# Patient Record
Sex: Male | Born: 1950 | Race: White | Hispanic: No | Marital: Married
Health system: Midwestern US, Community
[De-identification: ages and names within clinical notes are randomized; demographics above are authoritative.]

---
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
## 2013-01-12 NOTE — Plan of Care (Signed)
Outpatient Physical Therapy  [x]  [facility]   Phone: [phone]   Fax: [phone]   []  [place]    Phone: [phone]   Fax: [phone]  []  [place]   Phone: [phone]   Fax: [phone]     To: Referring Practitioner: Dr. Jacqulyn Bath      Patient: Parker Turner   DOB: 1950-07-15   MRN: [id number]  Evaluation Date: 01/12/2013      Diagnosis Information:  ?? Diagnosis: lumbosacral spondylosis without myelopathy   ?? Treatment Diagnosis: back and leg pain     Physical Therapy Certification/Re-Certification Form  Dear Dr. Jacqulyn Bath,   The following patient has been evaluated for physical therapy services and for therapy to continue, Medicare requires monthly physician review of the treatment plan. Please review the attached evaluation and/or summary of the patient's plan of care, and verify that you agree therapy should continue by signing the attached document and sending it back to our office.    Plan of Care/Treatment to date:  []  Therapeutic Exercise    [x]  Modalities: if needed  []  Therapeutic Activity     []  Ultrasound  []  Electrical Stimulation  []  Gait Training      []  Cervical Traction []  Lumbar Traction  []  Neuromuscular Re-education    []  Cold/hotpack []  Iontophoresis   []  Instruction in HEP     Other:  []  Manual Therapy      []              [x]  Aquatic Therapy      []            ?      Frequency/Duration:  # Days per week: []  1 day # Weeks: []  1 week []  5 weeks     [x]  2 days?   []  2 weeks []  6 weeks     []  3 days   []  3 weeks []  7 weeks     []  4 days   [x]  4 weeks []  8 weeks    Rehab Potential: []  Excellent [x]  Good []  Fair  []  Poor       Electronically signed by:  Thurston Pounds, PT [phone]      If you have any questions or concerns, please don't hesitate to call.  Thank you for your referral.      Physician Signature:________________________________Date:__________________  By signing above, therapist???s plan is approved by physician
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
## 2013-02-02 NOTE — Progress Notes (Signed)
Outpatient Physical Therapy  []  [facility]   Phone: [phone]   Fax: [phone]   []  [place]    Phone: [phone]   Fax: [phone]  []  [place]   Phone: [phone]   Fax: [phone]     Physical Therapy Prescription    Date: 02/02/2013    Patient Name: Parker Turner  DOB: [birth year]  MRN: [id number]    Diagnosis: lumbosacral spondylosis w/o myelopathy  Treatment Diagnosis:  Back and leg pain    Referring Physician:  Dr. Jacqulyn Bath    Progress Note:  PN 02/02/13   *  Initially pt had inc pain after pool exercise, but did not have inc pain after Tues 9/16  *pain is still the same in the legs with prolonged standing   * Has received TENS and using it on knees and back and plans to try on shoulder; seems helpful  * maybe 20% improved overall  * HS L -30 R -24  * pirif L mod tightness R Min tightness  * lumbar flex 42 ; L SB 9 R SB 15  * B LE L LE 4+/5 for hip flex, knee flex, others and R LE 5/5  * more aware of posture per pt; recent c/o L lat hand pain along 5th digit. 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 S/S Does not seem to be reproduced with neck ROM; possibly coming from shoulder or elbow. Pt to call MD to set up appt  * tol min in pool 65        With your approval we would like to add the following to the patient's current PT treatment:    [x]  Aquatic Exercise 2x wk x 4 wks   []  TENS Unit        []  [facility] Cervical Traction Unit        []  [facility] Lumbar Traction Unit    []  Quad/Standard Cane        []  Rolling/Standard Walker         []  Iontophoresis: Dexamethasone 4mg /ml injectable-30 ml vial     Quantity: 1 vial for iontophoresis     As needed        Electronically signed by:  Thurston Pounds, PT [phone]    If you have any questions or concerns, please don't hesitate to call.  Thank you for your referral.    Physician Signature:________________________________Date:__________________  By signing above, therapist???s plan is approved by physician
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
## 2013-02-09 NOTE — Other (Signed)
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  Pt c/o knee, ankle and feet pain.  Objective:  Observation:   Test measurements:      Key  B= Belt DB= Dumbells T= Theratube   G=Gloves N= Noodles W= Weights   P= Paddles WW=Water Walker K= Kickboard        Transfers:   steps with bilat handrails      % Immersion:  75%            Ambulation:   Exercises UE:      Forwards  4+1 Shoulder Shrugs     Lateral 2 Shoulder circles 10    Marching   1 Scapular Retraction  10    Retro   Rolling  10    Cariocas  Push Downs 10     IR/ER 10     Punching 10   Stretching: bilat Rowing 10   Gastroc/Soleus  20 sec X 3 Elbow Flex/Ext 10   Hamstring  20 sec X 3 Shldr Flex/Ext  10   Knee flex stretch   Shldr Abd/Add 10   Piriformis   Horiz Abd/Add  10   Hip flexor    Arm Circles  10   SKTC   PNF Diagonals    DKTC  UE oscillations f/b, s/s    ITB   Wall Push-ups    Quad  Figure 8's    Mid back   Buoy pull/push downs    UT  Tband rows    Rhom  Tband lats    Post Shoulder      Pec          Cervical:       AROM Flex       AROM Extension     LEs exercises:  bilat AROM Side Bending    Marching  10 AROM Rotation    Heel Raises  10 Chin tucks    Toe Raises  10 Chin nods     Squats  10      Hamstring Curls   10      Hip Flexion  10 Balance:     Hip Abduction  10 SLS 1 min each LE   Hip Extension  Tandem stance 30 sec x 2   Hip Circles  10 NBOS eyes open    Glut Set  10 NBOS eyes closed    TA set  10 Hand to Opposite Knee 10   Hip Adduction    Box Step     Hip IR   Noodle Stance  30 sec X 2  Hip ER  Stop/Go Gait    Fig 8's  Switch Gait                Seated: bilat Functional:    Ankle Pumps 30 Step up forward    Ankle circles 10 Step up lateral    Knee flex & ext 30 Step down    Hip Abd & Adduction 30 Shallow Water squats    Bicycle  30 as able Crate Lifts    Add Set with ball 10 Lunges forward    LX stab with med ball throws  Lunges lateral    Ankle INV  Lunges retro    Ankle EV  Lower ab curl with noodle      Upper ab curl with ball      Med ball straight lifts      Med ball diagonal lifts      Hydrorider          Noodle:      Leg Press 15 bilat Deep Water:    Noodle hang at wall 5 min regular [employer] Jog    Noodle hang deep water  Jumping Jacks    Noodle Bicycle  Heel to toe      Hand to opposite knee      Cross country skier      Rocking Horse    Verbal cues for proper posture and no increase pain with gait and exercise.      Timed Code Treatment Minutes:  75    Total Treatment Minutes:  75    Treatment/Activity Tolerance:  [x]  Patient tolerated treatment well []  Patient limited by fatique  []  Patient limited by pain []  Patient limited by other medical complications  []  Other:     Prognosis: [x]  Good []  Fair  []  Poor    Patient Requires Follow-up: [x]  Yes  []  No    Plan:   []  Continue per plan of care []  Alter current plan (see comments)  [x]  Plan of care initiated []  Hold pending MD visit []  Discharge    Plan for Next Session:  Gradually increase gait and exercise with an emphasis on posture, proper exercise tech, and no increase pain.  Therapist will educate patient on lumbopelvic stabilization with exercise and ADL's.  Increase LE and UE reps x 12 each.    Electronically signed by: Lynnda Shields, PTA,  [phone]

## 2013-02-14 NOTE — Other (Signed)
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, possible L RTC tear, OA and possible RA, 3 crushed lumbar vertebrae '76(no sx; wore a back brace for a year)    Diagnosis:  Lumbosacral spondylosis w/o myelopathy  Treatment Diagnosis:  Back and leg pain  Insurance/Certification information: [employer] (Sept 1)  Referring Physician:  Dr Jacqulyn Bath  Plan of care signed (Y/N): faxed 01/12/13     Visit# / total visits: 10/8  Pain level: 3-4/10 LB, knees and feet But was 7/10 LB pain over the weekend      Progress Note: [x]   Yes  []   No  Next due by: Visit #10:      Subjective:  Pt reports complaining of increase LB pain Friday and over the weekend.  Pt states getting a knee sleeve for the TENS unit however it does not seem to be working properly.  Pt to call TENS company to troubleshoot.    Objective:  Observation:   Test measurements:      Key  B= Belt DB= Dumbells T= Theratube   G=Gloves N= Noodles W= Weights   P= Paddles WW=Water Walker K= Kickboard        Transfers:   steps with bilat handrails      % Immersion:  75%            Ambulation:   Exercises UE:      Forwards  4+1 Shoulder Shrugs     Lateral 3 Shoulder circles 10    Marching   1 Scapular Retraction  10    Retro   Rolling  10    Cariocas  Push Downs 10     IR/ER 10     Punching 10   Stretching: bilat Rowing 10   Gastroc/Soleus  20 sec X 3 Elbow Flex/Ext 10   Hamstring  20 sec X 3 Shldr Flex/Ext  10   Knee flex stretch   Shldr Abd/Add 10   Piriformis   Horiz Abd/Add  10   Hip flexor    Arm Circles  10   SKTC   PNF Diagonals    DKTC  UE oscillations f/b, s/s    ITB   Wall Push-ups    Quad  Figure 8's    Mid back   Buoy pull/push downs    UT  Tband rows    Rhom  Tband lats    Post Shoulder      Pec          Cervical:       AROM Flex       AROM Extension      LEs exercises:  bilat AROM Side Bending    Marching  12 AROM Rotation    Heel Raises  12 Chin tucks    Toe Raises  12 Chin nods     Squats  12      Hamstring Curls  12      Hip Flexion  12 Balance:     Hip Abduction  12 SLS 1 min each LE   Hip Extension  Tandem stance 30 sec x 2   Hip Circles  12 NBOS eyes open    Glut Set  12 NBOS eyes closed    TA set  12 Hand to Opposite Knee 10   Hip Adduction    Box Step     Hip IR   Noodle Stance  30 sec X 2   Hip ER  Stop/Go Gait    Fig 8's  Switch Gait                Seated: bilat Functional: bilat   Ankle Pumps 30 Step up forward 5 each LE   Ankle circles 10 Step up lateral    Knee flex & ext 30 Step down    Hip Abd & Adduction 30 Shallow Water squats    Bicycle  30 as able Crate Lifts    Add Set with ball 10 Lunges forward    LX stab with med ball throws  Lunges lateral    Ankle INV  Lunges retro    Ankle EV  Lower ab curl with noodle      Upper ab curl with ball      Med ball straight lifts      Med ball diagonal lifts      Hydrorider          Noodle:      Leg Press 15 bilat Deep Water:    Noodle hang at wall Unable due to time Jog    Noodle hang deep water  Jumping Jacks    Noodle Bicycle  Heel to toe      Hand to opposite knee      Cross country skier      Rocking Horse    Verbal cues for proper posture and no increase pain with gait and exercise.      Timed Code Treatment Minutes:  75    Total Treatment Minutes:  75    Treatment/Activity Tolerance:  [x]  Patient tolerated treatment well []  Patient limited by fatique  []  Patient limited by pain []  Patient limited by other medical complications  []  Other:     Prognosis: [x]  Good []  Fair  []  Poor    Patient Requires Follow-up: [x]  Yes  []  No    Plan:   []  Continue per plan of care []  Alter current plan (see comments)  [x]  Plan of care initiated []  Hold pending MD visit []  Discharge    Plan for Next Session:  Gradually increase gait and exercise with an emphasis on posture, proper exercise tech, and no increase pain.   Therapist will educate patient on lumbopelvic stabilization with exercise and ADL's. Increase step ups as tolerated.  Add box step.    Electronically signed by: Lynnda Shields, PTA, [phone]

## 2013-02-16 NOTE — Other (Signed)
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  Pain decreased overall.  Objective:  Observation:   Test measurements:      Key  B= Belt DB= Dumbells T= Theratube   G=Gloves N= Noodles W= Weights   P= Paddles WW=Water Walker K= Kickboard        Transfers:   steps with bilat handrails      % Immersion:  75%            Ambulation:   Exercises UE:      Forwards  4+1 Shoulder Shrugs     Lateral 3 Shoulder circles 10    Marching   2 Scapular Retraction  10    Retro   Rolling  10    Cariocas  Push Downs 10     IR/ER 10     Punching 10   Stretching: bilat Rowing 10   Gastroc/Soleus  20 sec X 3 Elbow Flex/Ext 10   Hamstring  20 sec X 3 Shldr Flex/Ext  10   Knee flex stretch   Shldr Abd/Add 10   Piriformis   Horiz Abd/Add  10   Hip flexor    Arm Circles  10   SKTC   PNF Diagonals    DKTC  UE oscillations f/b, s/s    ITB   Wall Push-ups    Quad  Figure 8's    Mid back   Buoy pull/push downs    UT  Tband rows    Rhom  Tband lats    Post Shoulder      Pec          Cervical:       AROM Flex       AROM Extension     LEs exercises:  bilat AROM Side Bending    Marching  12 AROM Rotation    Heel Raises  12 Chin tucks    Toe Raises  12 Chin nods     Squats  12      Hamstring Curls  12       Hip Flexion  12 Balance:     Hip Abduction  12 SLS 1 min each LE   Hip Extension  Tandem stance 30 sec x 2   Hip Circles  12 NBOS eyes open    Glut Set  12 NBOS eyes closed    TA set  12 Hand to Opposite Knee 10   Hip Adduction    Box Step  3x each with verbal cues   Hip IR   Noodle Stance  30 sec X 2   Hip  ER  Stop/Go Gait    Fig 8's  Switch Gait                Seated: bilat Functional: bilat   Ankle Pumps 30 Step up forward 5 each LE   Ankle circles 10 Step up lateral    Knee flex & ext 30 Step down    Hip Abd & Adduction 30 Shallow Water squats    Bicycle  30 as able Crate Lifts    Add Set with ball 10 Lunges forward    LX stab with med ball throws  Lunges lateral    Ankle INV  Lunges retro    Ankle EV  Lower ab curl with noodle      Upper ab curl with ball      Med ball straight lifts      Med ball diagonal lifts      Hydrorider          Noodle:      Leg Press 15 bilat Deep Water:    Noodle hang at wall Unable due to time Jog    Noodle hang deep water  Jumping Jacks    Noodle Bicycle  Heel to toe      Hand to opposite knee      Cross country skier      Rocking Horse    Verbal cues for proper posture and no increase pain with gait and exercise.      Timed Code Treatment Minutes:  75    Total Treatment Minutes:  75    Treatment/Activity Tolerance:  [x]  Patient tolerated treatment well []  Patient limited by fatique  []  Patient limited by pain []  Patient limited by other medical complications  []  Other:     Prognosis: [x]  Good []  Fair  []  Poor    Patient Requires Follow-up: [x]  Yes  []  No    Plan:   []  Continue per plan of care []  Alter current plan (see comments)  [x]  Plan of care initiated []  Hold pending MD visit []  Discharge    Plan for Next Session:  Gradually increase gait and exercise with an emphasis on posture, proper exercise tech, and no increase pain.  Therapist will educate patient on lumbopelvic stabilization with exercise and ADL's. Increase step ups as tolerated.  Increase box step, add crate  lift.    Electronically signed by: Lynnda Shields, PTA, [phone]

## 2013-02-21 NOTE — Other (Signed)
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 Everything hurts today  Objective:  Observation:   Test measurements:      Key  B= Belt DB= Dumbells T= Theratube   G=Gloves N= Noodles W= Weights   P= Paddles WW=Water Walker K= Kickboard        Transfers:   steps with bilat handrails      % Immersion:  75%            Ambulation:   Exercises UE:      Forwards    3 Shoulder Shrugs     Lateral 3 Shoulder circles  12    Marching   1Scapular Retraction  12    Retro   Rolling  12    Cariocas  Push Downs  12     IR/ER  12     Punching  12   Stretching: bilat Rowing  12   Gastroc/Soleus  20 sec X 3 Elbow Flex/Ext  12   Hamstring  20 sec X 3 Shldr Flex/Ext  12   Knee flex stretch   Shldr Abd/Add  12   Piriformis   Horiz Abd/Add  12   Hip flexor    Arm Circles  12   SKTC   PNF Diagonals  12   DKTC  UE oscillations f/b, s/s    ITB   Wall Push-ups    Quad  Figure 8's    Mid back   Buoy pull/push downs    UT  Tband rows    Rhom  Tband lats    Post Shoulder      Pec          Cervical:       AROM Flex       AROM Extension     LEs exercises:  bilat AROM Side Bending    Marching  12 AROM Rotation    Heel Raises  12 Chin tucks    Toe Raises  12 Chin nods     Squats  12      Hamstring Curls  12      Hip Flexion  12 Balance:     Hip  Abduction  12 SLS 1 min each LE   Hip Extension  Tandem stance 30 sec x 2   Hip Circles  12 NBOS eyes open    Glut Set  12 NBOS eyes closed    TA set  12 Hand to Opposite Knee 10   Hip Adduction    Box Step  5x each with verbal cues   Hip IR   Noodle Stance  30 sec X 2  Hip ER  Stop/Go Gait    Fig 8's  Switch Gait                Seated: bilat Functional: bilat   Ankle Pumps 30 Step up forward 5 each LE   Ankle circles 10 Step up lateral    Knee flex & ext 30 Step down    Hip Abd & Adduction 30 Shallow Water squats    Bicycle  30 as able Crate Lifts    Add Set with ball 10 Lunges forward    LX stab with med ball throws  Lunges lateral    Ankle INV  Lunges retro    Ankle EV  Lower ab curl with noodle      Upper ab curl with ball      Med ball straight lifts      Med ball diagonal lifts      Hydrorider          Noodle:      Leg Press 15 bilat Deep Water:    Noodle hang at wall  Jog    Noodle hang deep water  Jumping Jacks    Noodle Bicycle  Heel to toe      Hand to opposite knee      Cross country skier      Rocking Horse    Verbal cues for proper posture and no increase pain with gait and exercise.      Timed Code Treatment Minutes:  75    Total Treatment Minutes:  75    Treatment/Activity Tolerance:  []  Patient tolerated treatment well []  Patient limited by fatique  [x]  Patient limited by pain []  Patient limited by other medical complications  [x]  Other: Exercises modified due to pain    Prognosis: [x]  Good []  Fair  []  Poor    Patient Requires Follow-up: [x]  Yes  []  No    Plan:   [x]  Continue per plan of care []  Alter current plan (see comments)  []  Plan of care initiated []  Hold pending MD visit []  Discharge    Plan for Next Session:  Gradually increase gait and exercise with an emphasis on posture, proper exercise tech, and no increase pain.  Therapist will educate patient on lumbopelvic stabilization with exercise and ADL's. Increase step ups as tolerated.  Resume ex and add crate lift as tol    Electronically  signed by: Georgeanna HarrisonJennifer A Yates, PTA  [phone]

## 2013-02-23 NOTE — Other (Signed)
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, possible L RTC tear, OA and possible RA, 3 crushed lumbar vertebrae '76(no sx; wore a back brace for a year)    Diagnosis:  Lumbosacral spondylosis w/o myelopathy  Treatment Diagnosis:  Back and leg pain  Insurance/Certification information: [employer] (Sept 1)  Referring Physician:  Dr Jacqulyn BathLong  Plan of care signed (Y/N): faxed 01/12/13     Visit# / total visits: 13/  Pain level:   3-4/10 LB pain     Progress Note: [x]   Yes  []   No  Next due by: Visit #10:      Subjective:  A little better today than Tuesday  Objective:  Observation:   Test measurements:      Key  B= Belt DB= Dumbells T= Theratube   G=Gloves N= Noodles W= Weights   P= Paddles WW=Water Walker K= Kickboard        Transfers:   steps with bilat handrails      % Immersion:  75%            Ambulation:   Exercises UE:      Forwards  4+1  Shoulder Shrugs     Lateral 3 Shoulder circles  12    Marching   1Scapular Retraction  12    Retro   Rolling  12    Cariocas  Push Downs  12     IR/ER  12     Punching  12   Stretching: bilat Rowing  12   Gastroc/Soleus  20 sec X 3 Elbow Flex/Ext  12   Hamstring  20 sec X 3 Shldr Flex/Ext  12   Knee flex stretch   Shldr Abd/Add  12   Piriformis   Horiz Abd/Add  12   Hip flexor    Arm Circles  12   SKTC   PNF Diagonals  12   DKTC  UE oscillations f/b, s/s    ITB   Wall Push-ups    Quad  Figure 8's    Mid back   Buoy pull/push downs    UT  Tband rows    Rhom  Tband lats    Post Shoulder      Pec          Cervical:       AROM Flex       AROM Extension     LEs exercises:  bilat AROM Side Bending    Marching  12 AROM Rotation    Heel Raises  12 Chin tucks    Toe Raises  12 Chin nods     Squats  12      Hamstring Curls  12      Hip Flexion  12 Balance:     Hip Abduction   12 SLS 1 min each LE   Hip Extension  Tandem stance 30 sec x 2   Hip Circles  12 NBOS eyes open    Glut Set  12 NBOS eyes closed    TA set  12 Hand to Opposite Knee 10   Hip Adduction    Box Step  5x each with verbal cues   Hip IR   Noodle Stance  30 sec X 2   Hip  ER  Stop/Go Gait    Fig 8's  Switch Gait                Seated: bilat Functional: bilat   Ankle Pumps 30 Step up forward 5 each LE   Ankle circles 10 Step up lateral    Knee flex & ext 30 Step down    Hip Abd & Adduction 30 Shallow Water squats    Bicycle  30 as able Crate Lifts    Add Set with ball 10 Lunges forward    LX stab with med ball throws  Lunges lateral    Ankle INV  Lunges retro    Ankle EV  Lower ab curl with noodle      Upper ab curl with ball      Med ball straight lifts      Med ball diagonal lifts      Hydrorider          Noodle:      Leg Press 15 bilat Deep Water:    Noodle hang at wall  Jog    Noodle hang deep water  Jumping Jacks    Noodle Bicycle  Heel to toe      Hand to opposite knee      Cross country skier      Rocking Horse    Verbal cues for proper posture and no increase pain with gait and exercise.      Timed Code Treatment Minutes:  75    Total Treatment Minutes:  75    Treatment/Activity Tolerance:   Patient tolerated treatment well  Patient limited by fatique   Patient limited by pain  Patient limited by other medical complications   Other:   Prognosis:  Good  Fair   Poor    Patient Requires Follow-up:  Yes   No    Plan:    Continue per plan of care  Alter current plan (see comments)   Plan of care initiated  Hold pending MD visit  Discharge    Plan for Next Session:  Gradually increase gait and exercise with an emphasis on posture, proper exercise tech, and no increase pain.  Therapist will educate patient on lumbopelvic stabilization with exercise and ADL's. Increase step ups as tolerated.   add crate lift as tol    Electronically signed by: Georgeanna Harrison, PTA  [phone]

## 2013-02-28 NOTE — Other (Signed)
Physical Therapy  Cancellation/No-show Note  Patient Name:  Parker Turner  DOB:  June 24, 1950   Date:  02/28/2013  Cancelled visits to date: 1  No-shows to date: 0    For today's appointment patient:  [x]   Cancelled  []   Rescheduled appointment  []   No-show     Reason given by patient:  []   Patient ill  []   Conflicting appointment  []   No transportation    []   Conflict with work  [x]   No reason given  []   Other:     Comments:      Electronically signed by:  Lynnda Shields, PTA  [phone]

## 2013-03-02 NOTE — Discharge Summary (Signed)
Physical Therapy Discharge Note  Date: 03/02/2013    Patient Name: Parker Turner  DOB: 05/04/1951  MRN: [id number]    Diagnosis:lumbosacral spondylosis w/o myelopathy  Treatment Diagnosis: back and leg pain    Referring Physician: Dr. Jacqulyn Bath    Dates of Service:[employer]  Total # of Visits:14  Cancel/No Shows to date:1    G-code (if applicable):    Date G-code Applied:       Treatment to Date:  []  Therapeutic Exercise  []  Modalities:  []  Therapeutic Activity     []  Ultrasound  []  Electrical Stim   []  Gait Training      []  Cervical Traction    []  Lumbar Traction  []  Neuromuscular Re-education  []  Cold/hotpack []  Iontophoresis  []  Instruction in HEP      Other:  []  Manual Therapy       []     [x]  Aquatic Therapy       []                     ?        Significant Findings At Last Visit:    PN 03/02/13   * recently diagnosed with RA; new meds, waiting for them to kick in  *very minimal pain improvement with PT   * cont hep daily  * still needs Meds and TENS for pain relief  *most importantly has better understanding of exercises needed to cont   *overall pain is still the same in the legs with prolonged standing   * HS L -30 R -22  * pirif L mod tightness R Min tightness  * lumbar flex 35 ; L SB 9 R SB 10 - inc tightness noted   * B LE L LE 4+/5 for hip flex, knee flex, others and R LE 5/5  * To have EMG on L UE for s/s in L elbow  * tol min in pool 65        Goal Status:  []  Achieved [x]  Partially Achieved  []  Not Achieved     Reason for Discharge:  []  Goals Met   []  Patient did not return after initial evaluation   [x]  Progress Plateaued []  Missed _____ scheduled appointments   []  No insurance coverage []  Patient terminated therapy   []  Other:        PT recommendation:   [x]  Patient now discharged with HEP      [x]  Other: issued 30 day FC pass to cont independently     Discharge discussed with:  [x]  Patient  []  Caregiver      []  Other        Electronically signed by:  Thurston Pounds, PT [phone]    If you have any  questions or concerns, please don't hesitate to call.  Thank you for your referral.
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
## 2013-11-08 NOTE — Plan of Care (Signed)
Outpatient Physical Therapy  [x]  [facility]   Phone: [phone]   Fax: [phone]   []  [facility]  Phone: [phone]              Fax: [phone]  []  [facility]   Phone: [phone]   Fax: [phone]     To: Referring Practitioner: Dr. Jacqulyn BathLong      Patient: Parker Turner   DOB: 09/08/1950   MRN: [id number]  Evaluation Date: 11/08/2013      Diagnosis Information:  ?? Diagnosis: LBP   ?? Treatment Diagnosis: pain and LLE weakness     Physical Therapy Certification/Re-Certification Form  Dear Dr. Jacqulyn BathLong  The following patient has been evaluated for physical therapy services and for therapy to continue, Medicare requires monthly physician review of the treatment plan. Please review the attached evaluation and/or summary of the patient's plan of care, and verify that you agree therapy should continue by signing the attached document and sending it back to our office.    Plan of Care/Treatment to date:  []  Therapeutic Exercise    []  Modalities:  []  Therapeutic Activity     []  Ultrasound  []  Electrical Stimulation  []  Gait Training      []  Cervical Traction []  Lumbar Traction  []  Neuromuscular Re-education    []  Cold/hotpack []  Iontophoresis   []  Instruction in HEP     Other:  []  Manual Therapy      []              [x]  Aquatic Therapy      []            ?      Frequency/Duration:  # Days per week: []  1 day # Weeks: []  1 week []  5 weeks     [x]  2 days?   []  2 weeks []  6 weeks     []  3 days   []  3 weeks []  7 weeks     []  4 days   []  4 weeks [x]  8 weeks    Rehab Potential: []  Excellent [x]  Good [x]  Fair  []  Poor       Electronically signed by:  Derrill KayMary Anand Tejada PT  [phone]    If you have any questions or concerns, please don't hesitate to call.  Thank you for your referral.      Physician Signature:________________________________Date:__________________  By signing above, therapist???s plan is approved by physician

## 2013-11-08 NOTE — Progress Notes (Signed)
Physical Therapy  Initial Assessment  Date: 11/08/2013  Patient Name: Parker BaliDonnie Briley  MRN: [id number]  DOB: 05/15/1951     Treatment Diagnosis: pain and LLE weakness    Restrictions  Restrictions/Precautions  Restrictions/Precautions: Fall Risk (minimal)    Subjective   General  Chart Reviewed: Yes  Patient assessed for rehabilitation services?: Yes  Additional Pertinent Hx: RA,OA PAD, cervical radiculopathy  Family / Caregiver Present: No  Referring Practitioner: Dr. Jacqulyn BathLong  Referral Date : 10/05/13  Diagnosis: LBP  PT Visit Information  Onset Date: 09/15/13  PT Insurance Information: [employer]  Total # of Visits Approved: 12  Total # of Visits to Date: 1  Subjective  Subjective: Patient has pain in am then decreases as the day progresses then increases  Pain Screening  Patient Currently in Pain: Yes  Pain Assessment  Pain Assessment: 0-10  Pain Level: 6  Pain Type: Chronic pain  Pain Location: Back;Elbow;Shoulder;Leg;Knee  Pain Orientation: Right;Left (left> right)  Pain Descriptors: Aching;Pins and needles  Pain Frequency: Intermittent  Pain Onset: Progressive  Clinical Progression: Gradually worsening  Patient's Stated Pain Goal: 2  Pain Intervention(s): Medication (see eMar);Heat applied;Shower  Multiple Pain Sites:  (RA/OA throughout the body)  Vision/Hearing     Home Living  Home Living  Type of Home: Apartment  Home Layout: One level  Bathroom Toilet: Standard  Bathroom Accessibility: Accessible  Objective  IADL History  Active Driver: Yes  Mode of Transportation: Car  Occupation: Retired  Leisure & Hobbies: fishing and hunting  Observation/Palpation  Posture: Fair  Palpation: ttp B LB  Body Mechanics: fair  Edema: Malleolii 27.5 cm, figure 8 52cm, mid foot 22 cm  PROM RLE (degrees)  RLE ROM: Exceptions  R Ankle Dorsiflexion 0-20: -8  R Ankle Plantar Flexion 0-45: 50  R Ankle Forefoot Inversion 0-40: 20  AROM RLE (degrees)  RLE AROM: Exceptions  R Ankle Dorsiflexion 0-20: -10  R Ankle Plantar Flexion 0-45:  50  R Ankle Forefoot Inversion 0-40: 15  Spine  Thoracic: increased kyphosis  Lumbar: WFL pain at end ranges  Special Tests: SLR neg B  Strength RLE  Strength RLE: WNL (5-/5)  Strength LLE  Strength LLE: Exception  L Hip Flexion: 4-/5  L Hip ABduction: 4-/5  L Knee Flexion: 4-/5  L Knee Extension: 4/5  L Ankle Dorsiflexion: 4-/5  L Ankle Plantar Flexion: 4/5     Additional Measures  Flexibility: HS L 45 deg, R 30 deg; B piriformis minimal tightness  Special Tests: neg SLR B  Assessment   Assessment  Assessment: Decreased functional mobility ;Decreased ADL status;Decreased strength;Decreased endurance  Assessment: patient has symptoms consistent with OA,RA and mostly with PAD  Comments: PLOF - adl's severely limited in last year from PAD  Treatment Diagnosis: pain and LLE weakness  Prognosis: Fair  Requires PT Follow Up: Yes  Timed Code Treatment Minutes: 20 Minutes  Total Treatment Time: 50  Activity Tolerance  Activity Tolerance: Patient limited by pain  Plan  Plan: Plan of care initiated         Plan   Plan  Plan: Plan of care initiated  Timed Code Treatment Minutes: 20 Minutes  Total Treatment Time: 50  Frequency and duration of tx  Days: 2  Weeks: 8    G-Code       Goals  Short term goals  Time Frame for Short term goals: 3 weeks  Short term goal 1: Decrease pain to 4/10 at worst  Short term goal 2: Patient  able to tolerate 45 min pool exercises without lower legs hurting  Short term goal 3: Increased ADL's by 25%  Long term goals  Time Frame for Long term goals : 6 weeks  Long term goal 1: Decrease pain to 0/10 in the water and 3/10 at worst  Long term goal 2: Patient able to tolerate 60 minutes of pool exercises  Long term goal 3: Patient able to return to some fishing activities per his goal       Derrill KayMary Glenna Brunkow  License and Documentation Cosign  Therapy License Number: [id number]  Cosign Documentation: Desma PaganiniMary Ann Chason Mciver,PT

## 2013-11-09 NOTE — Other (Signed)
Physical Therapy Aquatic Flow Sheet   Date:  11/09/2013    Patient Name:  Parker BaliDonnie Arno    DOB:  12/16/1950    Pertinent Medical History: RA, OA, PAD, cervical radiculopathy    Diagnosis:  LBP  Treatment Diagnosis:  LBP and L LE weakness    Insurance/Certification information: [employer]  Referring Physician:  Dr. Jacqulyn BathLong     Visit# / total visits:  2/8  Pain level: 6/10       Progress Note: []   Yes  [x]   No  Next due by: Visit #10:      Subjective:  Pain mainly in LEs, shoulders, and neck    Objective:  Observation: See eval  Test measurements:      Key  B= Belt DB= Dumbells T= Theratube   G=Gloves N= Noodles W= Weights   P= Paddles WW=Water Walker K= Kickboard        Transfers:  steps       % Immersion: 75%            Ambulation:   Exercises UE:      Forwards 2 Shoulder Shrugs 10x    Lateral 1 Shoulder circles 10x    Marching    Scapular Retraction 10x    Retro   Rolling 10x     Cariocas  Push Downs 10x     IR/ER 10x     Punching 10x   Stretching:  Rowing 10x   Gastroc/Soleus 3x20 sec  Elbow Flex/Ext 10x   Hamstring 3x20 sec  Shldr Flex/Ext     Knee flex stretch   Shldr Abd/Add    Piriformis   Horiz Abd/Add     Hip flexor    Arm Circles     SKTC   PNF Diagonals    DKTC  UE oscillations f/b, s/s    ITB   Wall Push-ups    Quad  Figure 8's    Mid back   Buoy pull/push downs    UT  Tband rows    Rhom  Tband lats    Post Shoulder  Lumbar Rot w/ paddles    Pec   Small ball pull downs             Cervical:       AROM Flex       AROM Extension     LEs exercises:   AROM Side Bending    Marching 10x  AROM Rotation    Heel Raises 10x  Chin tucks    Toe Raises 10x  Chin nods     Squats 10x       Hamstring Curls 10x       Hip Flexion 10x  Balance:     Hip Abduction 10x SLS    Hip Circles 10x Tandem stance    TA set 10x NBOS eyes open    Glut Set 10x NBOS eyes closed    Hip Extension  Hand to Opposite Knee    Hip Adduction    Box Step     Hip IR   Noodle Stance     Hip ER  Stop/Go Gait    Fig 8's  Switch Gait                 Seated:  Functional:    Ankle Pumps 10x Step up forward    Ankle circles  Step up lateral    Knee flex & ext 10x Step down    Hip Abd & Adduction 10x  Shallow Water squats    Bicycle  10x Crate Lifts    Add Set with ball 10x Lunges forward    LX stab with med ball throws  Lunges lateral    Ankle INV  Lunges retro    Ankle EV  Lower ab curl with noodle      Upper ab curl with ball      Med ball straight lifts      Med ball diagonal lifts      Hydrorider          Noodle:      Leg Press  Deep Water:    Noodle hang at wall  Jog    Noodle hang deep water  Jumping Jacks    Noodle Bicycle  Heel to toe      Hand to opposite knee      Cross country skier      Rocking Horse        Timed Code Treatment Minutes:  35    Total Treatment Minutes: 35    Treatment/Activity Tolerance:  [x]  Patient tolerated treatment well []  Patient limited by fatique  []  Patient limited by pain  []  Patient limited by other medical complications  []  Other:     Prognosis: [x]  Good []  Fair  []  Poor    Patient Requires Follow-up: [x]  Yes  []  No    Plan:   [x]  Continue per plan of care []  Alter current plan (see comments)  []  Plan of care initiated []  Hold pending MD visit []  Discharge    Plan for Next Session:  Increase chair and gait.  Add long lever UEs    Electronically signed by: Molli KnockLisa Marie  Rashell Shambaugh, PT [phone]

## 2013-11-14 NOTE — Other (Signed)
Physical Therapy Aquatic Flow Sheet   Date:  11/14/2013    Patient Name:  Parker Turner    DOB:  12/30/1950    Pertinent Medical History: RA, OA, PAD, cervical radiculopathy    Diagnosis:  LBP  Treatment Diagnosis:  LBP and L LE weakness    Insurance/Certification information: [employer]  Referring Physician:  Dr. Jacqulyn BathLong     Visit# / total visits:  3/8  Pain level: 5/10       Progress Note: []   Yes  [x]   No  Next due by: Visit #10:      Subjective:  Doing about the same today.     Objective:  Observation: See eval  Test measurements:      Key  B= Belt DB= Dumbells T= Theratube   G=Gloves N= Noodles W= Weights   P= Paddles WW=Water Walker K= Kickboard        Transfers:  steps       % Immersion: 75%            Ambulation:   Exercises UE:      Forwards 4 Shoulder Shrugs 10x    Lateral 2 Shoulder circles 10x    Marching    Scapular Retraction 10x    Retro   Rolling 10x     Cariocas  Push Downs 10x     IR/ER 10x     Punching 10x   Stretching:  Rowing 10x   Gastroc/Soleus 3x20 sec  Elbow Flex/Ext 10x   Hamstring 3x20 sec  Shldr Flex/Ext 10x    Knee flex stretch   Shldr Abd/Add 10x   Piriformis   Horiz Abd/Add 10x    Hip flexor    Arm Circles 10x    SKTC   PNF Diagonals 10x   DKTC  UE oscillations f/b, s/s    ITB   Wall Push-ups    Quad  Figure 8's    Mid back   Buoy pull/push downs    UT  Tband rows    Rhom  Tband lats    Post Shoulder  Lumbar Rot w/ paddles    Pec   Small ball pull downs             Cervical:       AROM Flex       AROM Extension     LEs exercises:   AROM Side Bending    Marching 10x  AROM Rotation    Heel Raises 10x  Chin tucks    Toe Raises 10x  Chin nods     Squats 10x       Hamstring Curls 10x       Hip Flexion 10x  Balance:     Hip Abduction 10x SLS    Hip Circles 10x Tandem stance    TA set 10x NBOS eyes open    Glut Set 10x NBOS eyes closed    Hip Extension  Hand to Opposite Knee    Hip Adduction    Box Step     Hip IR   Noodle Stance     Hip ER  Stop/Go Gait    Fig 8's  Switch Gait                 Seated:  Functional:    Ankle Pumps 15x Step up forward    Ankle circles  Step up lateral    Knee flex & ext 15x Step down    Hip Abd & Adduction 15x Shallow  Water squats    Bicycle  15x Crate Lifts    Add Set with ball 10x Lunges forward    LX stab with med ball throws  Lunges lateral    Ankle INV  Lunges retro    Ankle EV  Lower ab curl with noodle      Upper ab curl with ball      Med ball straight lifts      Med ball diagonal lifts      Hydrorider          Noodle:      Leg Press  Deep Water:    Noodle hang at wall  Jog    Noodle hang deep water x5 mins Jumping Jacks    Noodle Bicycle  Heel to toe      Hand to opposite knee      Cross country skier      Rocking Horse        Timed Code Treatment Minutes:  45    Total Treatment Minutes: 45    Treatment/Activity Tolerance:  [x]  Patient tolerated treatment well []  Patient limited by fatique  []  Patient limited by pain  []  Patient limited by other medical complications  []  Other:     Prognosis: [x]  Good []  Fair  []  Poor    Patient Requires Follow-up: [x]  Yes  []  No    Plan:   [x]  Continue per plan of care []  Alter current plan (see comments)  []  Plan of care initiated []  Hold pending MD visit []  Discharge    Plan for Next Session:  Increase chair.  Add SLS and H-K    Electronically signed by: Molli KnockLisa Marie  Dakai Braithwaite, PT [phone]

## 2013-11-16 NOTE — Other (Signed)
Physical Therapy Aquatic Flow Sheet   Date:  11/16/2013    Patient Name:  Parker Turner    DOB:  11/10/1950    Pertinent Medical History: RA, OA, PAD, cervical radiculopathy    Diagnosis:  LBP  Treatment Diagnosis:  LBP and L LE weakness    Insurance/Certification information: [employer]  Referring Physician:  Dr. Jacqulyn BathLong     Visit# / total visits:  4/8  Pain level: 6-7/10       Progress Note: []   Yes  [x]   No  Next due by: Visit #10:      Subjective:  No new complaints today    Objective:  Observation: See eval  Test measurements:      Key  B= Belt DB= Dumbells T= Theratube   G=Gloves N= Noodles W= Weights   P= Paddles WW=Water Walker K= Kickboard        Transfers:  steps       % Immersion: 75%            Ambulation:   Exercises UE:      Forwards 4 Shoulder Shrugs 10x    Lateral 2 Shoulder circles 10x    Marching    Scapular Retraction 10x    Retro   Rolling 10x     Cariocas  Push Downs 10x     IR/ER 10x     Punching 10x   Stretching:  Rowing 10x   Gastroc/Soleus 3x20 sec  Elbow Flex/Ext 10x   Hamstring 3x20 sec  Shldr Flex/Ext 10x    Knee flex stretch   Shldr Abd/Add 10x   Piriformis   Horiz Abd/Add 10x    Hip flexor    Arm Circles 10x    SKTC   PNF Diagonals 10x   DKTC  UE oscillations f/b, s/s    ITB   Wall Push-ups    Quad  Figure 8's    Mid back   Buoy pull/push downs    UT  Tband rows    Rhom  Tband lats    Post Shoulder  Lumbar Rot w/ paddles    Pec   Small ball pull downs             Cervical:       AROM Flex       AROM Extension     LEs exercises:   AROM Side Bending    Marching 10x  AROM Rotation    Heel Raises 10x  Chin tucks    Toe Raises 10x  Chin nods     Squats 10x       Hamstring Curls 10x       Hip Flexion 10x  Balance:     Hip Abduction 10x SLS x30 sec L/R   Hip Circles 10x Tandem stance    TA set 10x NBOS eyes open    Glut Set 10x NBOS eyes closed    Hip Extension  Hand to Opposite Knee 10x   Hip Adduction    Box Step     Hip IR   Noodle Stance     Hip ER  Stop/Go Gait    Fig 8's  Switch Gait                 Seated:  Functional:    Ankle Pumps 20x Step up forward    Ankle circles  Step up lateral    Knee flex & ext 20x Step down    Hip Abd & Adduction 20x Shallow  Water squats    Bicycle  20x Crate Lifts    Add Set with ball 10x Lunges forward    LX stab with med ball throws  Lunges lateral    Ankle INV  Lunges retro    Ankle EV  Lower ab curl with noodle      Upper ab curl with ball      Med ball straight lifts      Med ball diagonal lifts      Hydrorider          Noodle:      Leg Press  Deep Water:    Noodle hang at wall  Jog    Noodle hang deep water x5 mins Jumping Jacks    Noodle Bicycle  Heel to toe      Hand to opposite knee      Cross country skier      Rocking Horse        Timed Code Treatment Minutes:  45 (Pt seen 1-on-1 for 25 mins of session)    Total Treatment Minutes: 45    Treatment/Activity Tolerance:  [x]  Patient tolerated treatment well []  Patient limited by fatique  []  Patient limited by pain  []  Patient limited by other medical complications  []  Other:     Prognosis: [x]  Good []  Fair  []  Poor    Patient Requires Follow-up: [x]  Yes  []  No    Plan:   [x]  Continue per plan of care []  Alter current plan (see comments)  []  Plan of care initiated []  Hold pending MD visit []  Discharge    Plan for Next Session:  Add step ups    Electronically signed by: Molli KnockLisa Marie  Ottavio Norem, PT [phone]

## 2013-11-21 NOTE — Other (Signed)
Physical Therapy Aquatic Flow Sheet   Date:  11/21/2013    Patient Name:  Parker Turner    DOB:  02/08/1951    Pertinent Medical History: RA, OA, PAD, cervical radiculopathy    Diagnosis:  LBP  Treatment Diagnosis:  LBP and L LE weakness    Insurance/Certification information: [employer]  Referring Physician:  Dr. Jacqulyn BathLong     Visit# / total visits:  5/8  Pain level: 6-7/10       Progress Note: []   Yes  [x]   No  Next due by: Visit #10:      Subjective:  Had a rough weekend, especially with R shoulder pain.  States whenever he lays supine or on his sides he gets horrible R shoulder pain into scapula and down to R hand. Has ordered a water pillow to see if that helps.    Objective:  Observation: See eval  Test measurements:      Key  B= Belt DB= Dumbells T= Theratube   G=Gloves N= Noodles W= Weights   P= Paddles WW=Water Walker K= Kickboard        Transfers:  steps       % Immersion: 75%            Ambulation:   Exercises UE:      Forwards 4 Shoulder Shrugs 10x    Lateral 2 Shoulder circles 10x    Marching    Scapular Retraction 10x    Retro   Rolling 10x     Cariocas  Push Downs 10x     IR/ER 10x     Punching 10x   Stretching:  Rowing 10x   Gastroc/Soleus 3x20 sec  Elbow Flex/Ext 10x   Hamstring 3x20 sec  Shldr Flex/Ext 10x    Knee flex stretch   Shldr Abd/Add 10x   Piriformis   Horiz Abd/Add 10x    Hip flexor    Arm Circles 10x    SKTC   PNF Diagonals 10x   DKTC  UE oscillations f/b, s/s    ITB   Wall Push-ups    Quad  Figure 8's    Mid back   Buoy pull/push downs    UT  Tband rows    Rhom  Tband lats    Post Shoulder  Lumbar Rot w/ paddles    Pec   Small ball pull downs             Cervical:       AROM Flex       AROM Extension     LEs exercises:   AROM Side Bending    Marching 10x  AROM Rotation    Heel Raises 10x  Chin tucks    Toe Raises 10x  Chin nods     Squats 10x       Hamstring Curls 10x       Hip Flexion 10x  Balance:     Hip Abduction 10x SLS x30 sec L/R   Hip Circles 10x Tandem stance    TA set 10x NBOS  eyes open    Glut Set 10x NBOS eyes closed    Hip Extension  Hand to Opposite Knee 10x   Hip Adduction    Box Step     Hip IR   Noodle Stance     Hip ER  Stop/Go Gait    Fig 8's  Switch Gait                Seated:  Functional:    Ankle Pumps 20x Step up forward 10x L/R   Ankle circles  Step up lateral    Knee flex & ext 20x Step down    Hip Abd & Adduction 20x Shallow Water squats    Bicycle  20x Crate Lifts    Add Set with ball 10x Lunges forward    LX stab with med ball throws  Lunges lateral    Ankle INV  Lunges retro    Ankle EV  Lower ab curl with noodle      Upper ab curl with ball      Med ball straight lifts      Med ball diagonal lifts      Hydrorider          Noodle:      Leg Press  Deep Water:    Noodle hang at wall  Jog    Noodle hang deep water x5 mins Jumping Jacks    Noodle Bicycle  Heel to toe      Hand to opposite knee      Cross country skier      Rocking Horse        Timed Code Treatment Minutes:  60    Total Treatment Minutes: 60    Treatment/Activity Tolerance:  [x]  Patient tolerated treatment well []  Patient limited by fatique  []  Patient limited by pain  []  Patient limited by other medical complications  []  Other:     Prognosis: [x]  Good []  Fair  []  Poor    Patient Requires Follow-up: [x]  Yes  []  No    Plan:   [x]  Continue per plan of care []  Alter current plan (see comments)  []  Plan of care initiated []  Hold pending MD visit []  Discharge    Plan for Next Session:  Add leg press    Electronically signed by: Molli KnockLisa Marie  Eziah Negro, PT [phone]

## 2013-11-23 NOTE — Other (Signed)
Physical Therapy Aquatic Flow Sheet   Date:  11/23/2013    Patient Name:  Parker Turner    DOB:  05/04/1951    Pertinent Medical History: RA, OA, PAD, cervical radiculopathy    Diagnosis:  LBP  Treatment Diagnosis:  LBP and L LE weakness    Insurance/Certification information: [employer]  Referring Physician:  Dr. Jacqulyn Bath     Visit# / total visits:  6/8  Pain level: 5/10       Progress Note: []   Yes  [x]   No  Next due by: Visit #10:      Subjective:  Pain depends on what he is doing.    Objective:  Observation: See eval  Test measurements:      Key  B= Belt DB= Dumbells T= Theratube   G=Gloves N= Noodles W= Weights   P= Paddles WW=Water Walker K= Kickboard        Transfers:  steps       % Immersion: 75%            Ambulation:   Exercises UE:      Forwards 4 Shoulder Shrugs 10x    Lateral 2 Shoulder circles 10x    Marching    Scapular Retraction 10x    Retro   Rolling 10x     Cariocas  Push Downs 10x     IR/ER 10x     Punching 10x   Stretching:  Rowing 10x   Gastroc/Soleus 3x20 sec  Elbow Flex/Ext 10x   Hamstring 3x20 sec  Shldr Flex/Ext 10x    Knee flex stretch   Shldr Abd/Add 10x   Piriformis   Horiz Abd/Add 10x    Hip flexor    Arm Circles 10x    SKTC   PNF Diagonals 10x   DKTC  UE oscillations f/b, s/s    ITB   Wall Push-ups    Quad  Figure 8's    Mid back   Buoy pull/push downs    UT  Tband rows    Rhom  Tband lats    Post Shoulder  Lumbar Rot w/ paddles    Pec   Small ball pull downs             Cervical:       AROM Flex       AROM Extension     LEs exercises:   AROM Side Bending    Marching 10x  AROM Rotation    Heel Raises 10x  Chin tucks    Toe Raises 10x  Chin nods     Squats 10x       Hamstring Curls 10x       Hip Flexion 10x  Balance:     Hip Abduction 10x SLS x30 sec L/R   Hip Circles 10x Tandem stance    TA set 10x NBOS eyes open    Glut Set 10x NBOS eyes closed    Hip Extension  Hand to Opposite Knee 10x   Hip Adduction    Box Step     Hip IR   Noodle Stance     Hip ER  Stop/Go Gait    Fig 8's  Switch  Gait                Seated:  Functional:    Ankle Pumps 30x Step up forward 10x L/R   Ankle circles  Step up lateral    Knee flex & ext 30x Step down    Hip Abd &  Adduction 30x Shallow Water squats    Bicycle  30x Crate Lifts    Add Set with ball 10x Lunges forward    LX stab with med ball throws  Lunges lateral    Ankle INV  Lunges retro    Ankle EV  Lower ab curl with noodle      Upper ab curl with ball      Med ball straight lifts      Med ball diagonal lifts      Hydrorider          Noodle:      Leg Press Small 10x L/R Deep Water:    Noodle hang at wall  Jog    Noodle hang deep water x5 mins Jumping Jacks    Noodle Bicycle  Heel to toe      Hand to opposite knee      Cross country skier      Rocking Horse        Timed Code Treatment Minutes:  55 (pt seen 1-on-1 for 45 mins)    Total Treatment Minutes: 55    Treatment/Activity Tolerance:  [x]  Patient tolerated treatment well []  Patient limited by fatique  []  Patient limited by pain  []  Patient limited by other medical complications  []  Other:     Prognosis: [x]  Good []  Fair  []  Poor    Patient Requires Follow-up: [x]  Yes  []  No    Plan:   [x]  Continue per plan of care []  Alter current plan (see comments)  []  Plan of care initiated []  Hold pending MD visit []  Discharge    Plan for Next Session:  Add lateral step up.  Increase gait.     Electronically signed by: Molli Knock, PT [phone]

## 2013-11-28 NOTE — Other (Signed)
Physical Therapy Aquatic Flow Sheet   Date:  11/28/2013    Patient Name:  Parker Turner    DOB:  08-Feb-1951    Pertinent Medical History: RA, OA, PAD, cervical radiculopathy    Diagnosis:  LBP  Treatment Diagnosis:  LBP and L LE weakness    Insurance/Certification information: [employer]  Referring Physician:  Dr. Jacqulyn Bath     Visit# / total visits:  7/8  Pain level: 4-5/10       Progress Note: []   Yes  [x]   No  Next due by: Visit #10:      Subjective:  Pain depends on what he is doing. Reports he also has leg pain due to vascular problems.     Objective:  Observation: See eval  Test measurements:      Key  B= Belt DB= Dumbells T= Theratube   G=Gloves N= Noodles W= Weights   P= Paddles WW=Water Walker K= Kickboard        Transfers:  steps       % Immersion: 75%            Ambulation:   Exercises UE:      Forwards 4 +2 Shoulder Shrugs 10x    Lateral 2 Shoulder circles 10x    Marching    Scapular Retraction 10x    Retro   Rolling 10x     Cariocas  Push Downs 10x     IR/ER 10x     Punching 10x   Stretching:  Rowing 10x   Gastroc/Soleus 3x20 sec  Elbow Flex/Ext 10x   Hamstring 3x20 sec  Shldr Flex/Ext 10x    Knee flex stretch   Shldr Abd/Add 10x   Piriformis   Horiz Abd/Add 10x    Hip flexor    Arm Circles 10x    SKTC   PNF Diagonals 10x   DKTC  UE oscillations f/b, s/s    ITB   Wall Push-ups    Quad  Figure 8's    Mid back   Buoy pull/push downs    UT  Tband rows    Rhom  Tband lats    Post Shoulder  Lumbar Rot w/ paddles    Pec   Small ball pull downs             Cervical:       AROM Flex       AROM Extension     LEs exercises:   AROM Side Bending    Marching 10x  AROM Rotation    Heel Raises 10x  Chin tucks    Toe Raises 10x  Chin nods     Squats 10x       Hamstring Curls 10x       Hip Flexion 10x  Balance:     Hip Abduction 10x SLS x60 sec L/R   Hip Circles 10x Tandem stance    TA set 10x NBOS eyes open    Glut Set 10x NBOS eyes closed    Hip Extension  Hand to Opposite Knee 10x   Hip Adduction    Box Step     Hip  IR   Noodle Stance     Hip ER  Stop/Go Gait    Fig 8's  Switch Gait                Seated:  Functional:    Ankle Pumps 30x Step up forward 10x L/R   Ankle circles 10x Step up lateral 10x L/R  Knee flex & ext 30x Step down    Hip Abd & Adduction 30x Shallow Water squats    Bicycle  30x Crate Lifts    Add Set with ball 10x Lunges forward    LX stab with med ball throws  Lunges lateral    Ankle INV  Lunges retro    Ankle EV  Lower ab curl with noodle      Upper ab curl with ball      Med ball straight lifts      Med ball diagonal lifts      Hydrorider          Noodle:      Leg Press Small 10x L/R Deep Water:    Noodle hang at wall  Jog    Noodle hang deep water x5 mins Jumping Jacks    Noodle Bicycle  Heel to toe      Hand to opposite knee      Cross country skier      Rocking Horse        Timed Code Treatment Minutes:  60     Total Treatment Minutes: 60    Treatment/Activity Tolerance:  [x]  Patient tolerated treatment well []  Patient limited by fatique  []  Patient limited by pain  []  Patient limited by other medical complications  []  Other:     Prognosis: [x]  Good []  Fair  []  Poor    Patient Requires Follow-up: [x]  Yes  []  No    Plan:   [x]  Continue per plan of care []  Alter current plan (see comments)  []  Plan of care initiated []  Hold pending MD visit []  Discharge    Plan for Next Session: .  Increase gait.     Electronically signed by: Telford NabKimberly J Katy Brickell, PTA  [phone]

## 2013-11-30 NOTE — Other (Signed)
Physical Therapy Aquatic Flow Sheet   Date:  11/30/2013    Patient Name:  Parker Turner    DOB:  04/22/1951    Pertinent Medical History: RA, OA, PAD, cervical radiculopathy    Diagnosis:  LBP  Treatment Diagnosis:  LBP and L LE weakness    Insurance/Certification information: [employer]  Referring Physician:  Dr. Jacqulyn BathLong     Visit# / total visits:  8/8   Pain level: 4-5/10       Progress Note: []   Yes  [x]   No  Next due by: Visit #10:      Subjective:  Pain depends on what he is doing. Reports he also has leg pain due to vascular problems.  Reports has new PT script for R shld.     Objective:  Observation: See eval  Test measurements:      Key  B= Belt DB= Dumbells T= Theratube   G=Gloves N= Noodles W= Weights   P= Paddles WW=Water Walker K= Kickboard        Transfers:  steps       % Immersion: 75%            Ambulation:   Exercises UE:      Forwards 6 Shoulder Shrugs 10x    Lateral 2 Shoulder circles 10x    Marching    Scapular Retraction 10x    Retro   Rolling 10x     Cariocas  Push Downs 10x     IR/ER 10x     Punching 10x   Stretching:  Rowing 10x   Gastroc/Soleus 3x20 sec  Elbow Flex/Ext 10x   Hamstring 3x20 sec  Shldr Flex/Ext 10x    Knee flex stretch   Shldr Abd/Add 10x   Piriformis   Horiz Abd/Add 10x    Hip flexor    Arm Circles 10x    SKTC   PNF Diagonals 10x   DKTC 1 min UE oscillations f/b, s/s    ITB   Wall Push-ups    Quad  Figure 8's    Mid back   Buoy pull/push downs    UT  Tband rows    Rhom  Tband lats    Post Shoulder  Lumbar Rot w/ paddles    Pec   Small ball pull downs             Cervical:       AROM Flex       AROM Extension     LEs exercises:   AROM Side Bending    Marching 10x  AROM Rotation    Heel Raises 10x  Chin tucks    Toe Raises 10x  Chin nods     Squats 10x       Hamstring Curls 10x       Hip Flexion 10x  Balance:     Hip Abduction 10x SLS x60 sec L/R   Hip Circles 10x Tandem stance    TA set 10x NBOS eyes open    Glut Set 10x NBOS eyes closed    Hip Extension  Hand to Opposite  Knee 10x   Hip Adduction    Box Step     Hip IR   Noodle Stance     Hip ER  Stop/Go Gait    Fig 8's  Switch Gait                Seated:  Functional:    Ankle Pumps 30x Step up forward 10x L/R  Ankle circles 10x Step up lateral 10x L/R   Knee flex & ext 30x Step down    Hip Abd & Adduction 30x Shallow Water squats 10   Bicycle  30x Crate Lifts    Add Set with ball 10x Lunges forward    LX stab with med ball throws  Lunges lateral    Ankle INV  Lunges retro    Ankle EV  Lower ab curl with noodle      Upper ab curl with ball      Med ball straight lifts      Med ball diagonal lifts      Hydrorider          Noodle:      Leg Press Small 10x L/R Deep Water:    Noodle hang at wall  Jog    Noodle hang deep water x5 mins Jumping Jacks    Noodle Bicycle  Heel to toe      Hand to opposite knee      Cross country skier      Rocking Horse        Timed Code Treatment Minutes:  65    Total Treatment Minutes: 65    Treatment/Activity Tolerance:  [x]  Patient tolerated treatment well []  Patient limited by fatique  []  Patient limited by pain  []  Patient limited by other medical complications  [x]  Other: recommend ice to Shlds prn    Prognosis: [x]  Good []  Fair  []  Poor    Patient Requires Follow-up: [x]  Yes  []  No    Plan:   [x]  Continue per plan of care []  Alter current plan (see comments)  []  Plan of care initiated []  Hold pending MD visit []  Discharge    Plan for Next Session: .  Increase gait.  PT eval for R shld 12-07-13    Electronically signed by: Telford Nab, PTA  [phone]

## 2013-12-05 NOTE — Other (Signed)
Physical Therapy Aquatic Flow Sheet   Date:  12/05/2013    Patient Name:  Parker Turner    DOB:  1950/12/02    Pertinent Medical History: RA, OA, PAD, cervical radiculopathy    Diagnosis:  LBP  Treatment Diagnosis:  LBP and L LE weakness    Insurance/Certification information: [employer]  Referring Physician:  Dr. Jacqulyn Bath     Visit# / total visits:  9/16   Pain level: 4-5/10       Progress Note: []   Yes  [x]   No  Next due by: Visit #10:      Subjective: LBP is about the same.  Shoulders are the main c/o    Objective:  Observation: See eval  Test measurements:      Key  B= Belt DB= Dumbells T= Theratube   G=Gloves N= Noodles W= Weights   P= Paddles WW=Water Walker K= Kickboard        Transfers:  steps       % Immersion: 75%            Ambulation:   Exercises UE:      Forwards 6 Shoulder Shrugs 10x    Lateral 2 Shoulder circles 10x    Marching 1 Scapular Retraction 10x    Retro   Rolling 10x     Cariocas  Push Downs 10x     IR/ER 10x     Punching 10x   Stretching:  Rowing 10x   Gastroc/Soleus 3x20 sec  Elbow Flex/Ext 10x   Hamstring 3x20 sec  Shldr Flex/Ext 10x    Knee flex stretch   Shldr Abd/Add 10x   Piriformis   Horiz Abd/Add 10x    Hip flexor    Arm Circles 10x    SKTC   PNF Diagonals 10x   DKTC 1 min UE oscillations f/b, s/s    ITB   Wall Push-ups    Quad  Figure 8's    Mid back   Buoy pull/push downs    UT  Tband rows    Rhom  Tband lats    Post Shoulder  Lumbar Rot w/ paddles    Pec   Small ball pull downs             Cervical:       AROM Flex       AROM Extension     LEs exercises:   AROM Side Bending    Marching 10x  AROM Rotation    Heel Raises 10x  Chin tucks    Toe Raises 10x  Chin nods     Squats 10x       Hamstring Curls 10x       Hip Flexion 10x  Balance:     Hip Abduction 10x SLS x60 sec L/R   Hip Circles 10x Tandem stance    TA set 10x NBOS eyes open    Glut Set 10x NBOS eyes closed    Hip Extension  Hand to Opposite Knee 10x   Hip Adduction    Box Step     Hip IR   Noodle Stance     Hip ER   Stop/Go Gait    Fig 8's  Switch Gait                Seated:  Functional:    Ankle Pumps 30x Step up forward 10x L/R   Ankle circles 10x Step up lateral 10x L/R   Knee flex & ext 30x Step down 10x  L/R   Hip Abd & Adduction 30x Shallow Water squats 10x   Bicycle  30x Crate Lifts    Add Set with ball 10x Lunges forward    LX stab with med ball throws  Lunges lateral    Ankle INV  Lunges retro    Ankle EV  Lower ab curl with noodle      Upper ab curl with ball      Med ball straight lifts      Med ball diagonal lifts      Hydrorider          Noodle:      Leg Press Small 10x L/R Deep Water:    Noodle hang at wall  Jog    Noodle hang deep water x5 mins Jumping Jacks    Noodle Bicycle  Heel to toe      Hand to opposite knee      Cross country skier      Rocking Horse        Timed Code Treatment Minutes:  60    Total Treatment Minutes: 60    Treatment/Activity Tolerance:  [x]  Patient tolerated treatment well []  Patient limited by fatique  []  Patient limited by pain  []  Patient limited by other medical complications  []  Other:    Prognosis: [x]  Good []  Fair  []  Poor    Patient Requires Follow-up: [x]  Yes  []  No    Plan:   [x]  Continue per plan of care []  Alter current plan (see comments)  []  Plan of care initiated []  Hold pending MD visit []  Discharge    Plan for Next Session: add lumbar rot with paddle    Electronically signed by: Molli Knock, PT  [phone]

## 2013-12-07 NOTE — Other (Signed)
Physical Therapy Aquatic Flow Sheet   Date:  12/07/2013    Patient Name:  Parker Turner    DOB:  07-Feb-1951    Pertinent Medical History: RA, OA, PAD, cervical radiculopathy    Diagnosis:  LBP  Treatment Diagnosis:  LBP and L LE weakness    Insurance/Certification information: [employer]  Referring Physician:  Dr. Jacqulyn Bath     Visit# / total visits:  10/16   Pain level: 3/10       Progress Note: []   Yes  [x]   No  Next due by: Visit #10:      Subjective: LBP is not too bad.  Shoulders especially R shoulder is still main complaint.  Notes having pain with using the weed eater the other day which required him to stop.    Objective:  Observation: See eval  Test measurements:      Key  B= Belt DB= Dumbells T= Theratube   G=Gloves N= Noodles W= Weights   P= Paddles WW=Water Walker K= Kickboard        Transfers:  steps       % Immersion: 75%            Ambulation:   Exercises UE:      Forwards 6 Shoulder Shrugs 10x    Lateral 2 Shoulder circles 10x    Marching 1 Scapular Retraction 10x    Retro   Rolling 10x     Cariocas  Push Downs 10x     IR/ER 10x     Punching 10x   Stretching:  Rowing 10x   Gastroc/Soleus 3x20 sec  Elbow Flex/Ext 10x   Hamstring 3x20 sec  Shldr Flex/Ext 10x    Knee flex stretch   Shldr Abd/Add 10x   Piriformis   Horiz Abd/Add 10x    Hip flexor    Arm Circles 10x    SKTC   PNF Diagonals 10x   DKTC 1 min UE oscillations f/b, s/s    ITB   Wall Push-ups    Quad  Figure 8's    Mid back   Buoy pull/push downs    UT  Tband rows    Rhom  Tband lats    Post Shoulder  Lumbar Rot w/ paddles L1 10x L/R   Pec 2x20 secs with noodle  Small ball pull downs             Cervical:       AROM Flex       AROM Extension     LEs exercises:   AROM Side Bending    Marching 10x  AROM Rotation    Heel Raises 10x  Chin tucks    Toe Raises 10x  Chin nods     Squats 10x       Hamstring Curls 10x       Hip Flexion 10x  Balance:     Hip Abduction 10x SLS x60 sec L/R   Hip Circles 10x Tandem stance    TA set 10x NBOS eyes open     Glut Set 10x NBOS eyes closed    Hip Extension  Hand to Opposite Knee 10x   Hip Adduction    Box Step     Hip IR   Noodle Stance     Hip ER  Stop/Go Gait    Fig 8's  Switch Gait                Seated:  Functional:    Ankle Pumps 30x  Step up forward 10x L/R   Ankle circles 10x Step up lateral 10x L/R   Knee flex & ext 30x Step down 10x L/R   Hip Abd & Adduction 30x Shallow Water squats 10x   Bicycle  30x Crate Lifts    Add Set with ball 10x Lunges forward    LX stab with med ball throws  Lunges lateral    Ankle INV  Lunges retro    Ankle EV  Lower ab curl with noodle      Upper ab curl with ball      Med ball straight lifts      Med ball diagonal lifts      Hydrorider          Noodle:      Leg Press Small 10x L/R Deep Water:    Noodle hang at wall  Jog    Noodle hang deep water x5 mins Jumping Jacks    Noodle Bicycle  Heel to toe      Hand to opposite knee      Cross country skier      Rocking Horse        Timed Code Treatment Minutes:  60    Total Treatment Minutes: 60    Treatment/Activity Tolerance:  [x]  Patient tolerated treatment well []  Patient limited by fatique  []  Patient limited by pain  []  Patient limited by other medical complications  []  Other:    Prognosis: [x]  Good []  Fair  []  Poor    Patient Requires Follow-up: [x]  Yes  []  No    Plan:   [x]  Continue per plan of care []  Alter current plan (see comments)  []  Plan of care initiated []  Hold pending MD visit []  Discharge    Plan for Next Session: increase LEs    Electronically signed by: Molli KnockLisa Marie  Sophie Quiles, PT  [phone]

## 2013-12-07 NOTE — Progress Notes (Addendum)
Outpatient Physical Therapy  []  [facility]   Phone: [phone]   Fax: [phone]   []  [facility]  Phone: [phone]   Fax: [phone]  []  [facility]              Phone: [phone]   Fax: [phone]     Physical Therapy Progress/Discharge Note  Date: 12/07/2013        Patient Name:  Parker Turner    DOB:  1950-09-11  MRN: [id number]  Patient Name: Parker Turner DOB: 04/05/1951    Pertinent Medical History: RA, OA, PAD, cervical radiculopathy    Diagnosis: Right shoulder pain  Treatment Diagnosis: weakness and tight pecs and biceps tendon impingement  Insurance/Certification information: [employer]  Referring Physician: Dr. Jacqulyn Bath  Visit# / total visits:  1/8   Pain level: 7/10       Plan of Care/Treatment to date:  []  Therapeutic Exercise    []  Modalities:  []  Therapeutic Activity     []  Ultrasound  []  Electrical Stimulation  []  Gait Training      []  Cervical Traction    []  Lumbar Traction  []  Neuromuscular Re-education  []  Cold/hotpack [x]  Iontophoresis  [x]  Instruction in HEP      Other:  [x]  Manual Therapy       []     []  Aquatic Therapy       []                     ?      Significant Findings At Last Visit/Comments:    Subjective:    Patient has increased pain at night when he rolls on right side. Referred into upper arm.     Objective:  Observation: forward head and shoulders with tight pecs  Test measurements:  RTC strength 4+/5  , TTP biceps tendon, tight pecs and head of humerus anterior in socket           Flex 150 pain at end range, abd 142 with pain, IR/ER 70 deg pain in chest area       Assessment:  Summary: patient has symptoms consistent with poor biomechanics of the shoulder/scapula  Complex, biceps impingement and tight pecs        Current Frequency/Duration:  # Days per week: []  1 day # Weeks: []  1 week [x]  4 weeks      [x]  2 days?   []  2 weeks []  5 weeks      []  3 days   []  3 weeks []  6 weeks     Rehab Potential: []  Excellent [x]  Good []  Fair  []  Poor     Plan:    STM to right  shoulder complex and manual stretching of pecs, posterior and inferior glides and  Electronically signed by:  Christena Flake  [phone]    If you have any questions or concerns, please don't hesitate to call.  Thank you for your referral.    Physician Signature:________________________________Date:__________________  By signing above, therapist???s plan is approved by physician

## 2013-12-13 NOTE — Progress Notes (Signed)
Outpatient Physical Therapy  []  [facility]   Phone: [phone]   Fax: [phone]   []  [facility]   Phone: [phone]   Fax: [phone]  []  [facility]   Phone: [phone]   Fax: [phone]     Physical Therapy Prescription    Date: 12/13/2013    Patient Name: Parker Turner  DOB: 08/26/1950  MRN: [id number]  Patient Name: Parker Turner DOB: 09/01/1950    Pertinent Medical History: RA, OA, PAD, cervical radiculopathy    Diagnosis: Right shoulder pain  Treatment Diagnosis: weakness and tight pecs and biceps tendon impingement  Insurance/Certification information: [employer]  Referring Physician: Dr. Jacqulyn BathLong  Visit# / total visits:  1/8   Pain level: 7/10       Drug Allergies:  NKA    With your approval we would like to add the following to the patient's current PT treatment:    []  Aquatic Exercise    []  TENS Unit        []  [facility]        []  [facility]    []  Quad/Standard Cane        []  Rolling/Standard Walker         [x]  Iontophoresis: Dexamethasone 4mg /ml injectable-30 ml vial     Quantity: 1 vial for iontophoresis     As needed        Electronically signed by:  Derrill KayMary Chandrea Zellman PT  [phone]    If you have any questions or concerns, please don't hesitate to call.  Thank you for your referral.    Physician Signature:________________________________Date:__________________  By signing above, therapist???s plan is approved by physician

## 2013-12-14 NOTE — Other (Signed)
Physical Therapy Daily Treatment Note  Date:  12/14/2013    Patient Name:  Parker Turner    DOB:  07/25/1950  MRN: [id number]  Restrictions/Precautions:    Pertinent Medical History:RA, OA, PAD, cervical radiculopathy   Medical/Treatment Diagnosis Information:Diagnosis:   Right shoulder pain  Treatment Diagnosis: weakness and tight pecs and biceps tendon impingement  ??    ??    Insurance/Certification information:   [employer]ophysicist/field seism[employer]ource  Physician Information:   Dr. Jacqulyn BathLong  Plan of care signed (Y/N):      Visit# / total visits :2  / 8    Pain level: 3-4/10 At present , can increase to 7-8/10    G-Code (if applicable):      Date / Visit # G-Code Applied:  /       Progress Note: []   Yes  [x]   No  Next due by: Visit #10      History of Injury:    Subjective:   Tape increases postural awareness     Objective:  Observation:   Test measurements:      Exercises:  Exercise/Equipment Resistance/Repetitions Other comments        Door pec stretch 20 sec x 3                                         McConnell tape R shld  1 & 2  applied              US 50 % R biceps tendon 1.5 w/cm 2 x 8 min    STM  R  shld 10 min      Other Therapeutic Activities:      Home Exercise Program:      Manual Treatments:      Modalities:      Timed Code Treatment Minutes:  35    Total Treatment Minutes:  35    Treatment/Activity Tolerance:  [x]  Patient tolerated treatment well []  Patient limited by fatigue  []  Patient limited by pain  []  Patient limited by other medical complications  []  Other:     Prognosis: [x]  Good []  Fair  []  Poor    Patient Requires Follow-up: [x]  Yes  []  No    Plan:   [x]  Continue per plan of care []  Alter current plan (see comments)  []  Plan of care initiated []  Hold pending MD visit []  Discharge    Plan for Next Session:   Cont US /STM, and McConnell taping.  Await Rx for iontophoresis from MD.     Electronically signed by:  Telford NabKimberly J Raeshaun Simson, PTA [phone]

## 2013-12-14 NOTE — Other (Signed)
Physical Therapy Aquatic Flow Sheet   Date:  12/14/2013    Patient Name:  Parker Turner    DOB:  07/21/1950    Pertinent Medical History: RA, OA, PAD, cervical radiculopathy    Diagnosis:  LBP  Treatment Diagnosis:  LBP and L LE weakness    Insurance/Certification information: [employer]  Referring Physician:  Dr. Jacqulyn BathLong     Visit# / total visits:  11/16   Pain level: 3/10       Progress Note: []   Yes  [x]   No  Next due by: Visit #10:      Subjective:  Can tell he was not here on Tuesday.  LBP 3/10, shoulder 5-6/10 on avg.     Objective:  Observation: See eval  Test measurements:      Key  B= Belt DB= Dumbells T= Theratube   G=Gloves N= Noodles W= Weights   P= Paddles WW=Water Walker K= Kickboard        Transfers:  steps       % Immersion: 75%            Ambulation:   Exercises UE:      Forwards 6 Shoulder Shrugs 10x    Lateral 2 Shoulder circles 10x    Marching 1 Scapular Retraction 10x    Retro   Rolling 10x     Cariocas  Push Downs 10x     IR/ER 10x     Punching 10x   Stretching:  Rowing 10x   Gastroc/Soleus 3x20 sec  Elbow Flex/Ext 10x   Hamstring 3x20 sec  Shldr Flex/Ext 10x    Knee flex stretch   Shldr Abd/Add 10x   Piriformis   Horiz Abd/Add 10x    Hip flexor    Arm Circles 10x    SKTC   PNF Diagonals 10x   DKTC 1 min UE oscillations f/b, s/s    ITB   Wall Push-ups    Quad  Figure 8's    Mid back   Buoy pull/push downs    UT  Tband rows    Rhom  Tband lats    Post Shoulder  Lumbar Rot w/ paddles L1 10x L/R   Pec 2x20 secs with noodle  Small ball pull downs             Cervical:       AROM Flex       AROM Extension     LEs exercises:   AROM Side Bending    Marching 12x  AROM Rotation    Heel Raises 12x  Chin tucks    Toe Raises 12x  Chin nods     Squats 12x       Hamstring Curls 12x       Hip Flexion 12x  Balance:     Hip Abduction 12x SLS x60 sec L/R   Hip Circles 12x Tandem stance    TA set 12x NBOS eyes open    Glut Set 12x NBOS eyes closed    Hip Extension  Hand to Opposite Knee 10x   Hip Adduction     Box Step     Hip IR   Noodle Stance     Hip ER  Stop/Go Gait    Fig 8's  Switch Gait                Seated:  Functional:    Ankle Pumps 30x Step up forward 10x L/R   Ankle circles 10x Step up lateral 10x  L/R   Knee flex & ext 30x Step down 10x L/R   Hip Abd & Adduction 30x Shallow Water squats 10x   Bicycle  30x Crate Lifts    Add Set with ball 10x Lunges forward    LX stab with med ball throws  Lunges lateral    Ankle INV  Lunges retro    Ankle EV  Lower ab curl with noodle      Upper ab curl with ball      Med ball straight lifts      Med ball diagonal lifts      Hydrorider          Noodle:      Leg Press Small 10x L/R Deep Water:    Noodle hang at wall  Jog    Noodle hang deep water x5 mins Jumping Jacks    Noodle Bicycle  Heel to toe      Hand to opposite knee      Cross country skier      Rocking Horse        Timed Code Treatment Minutes:  60 (pt seen 1-on-1 for 45 mins of session)    Total Treatment Minutes: 60    Treatment/Activity Tolerance:  [x]  Patient tolerated treatment well []  Patient limited by fatique  []  Patient limited by pain  []  Patient limited by other medical complications  []  Other:    Prognosis: [x]  Good []  Fair  []  Poor    Patient Requires Follow-up: [x]  Yes  []  No    Plan:   [x]  Continue per plan of care []  Alter current plan (see comments)  []  Plan of care initiated []  Hold pending MD visit []  Discharge    Plan for Next Session: add small ball pushdowns    Electronically signed by: Molli KnockLisa Marie  Jermichael Belmares, PT  [phone]

## 2013-12-19 NOTE — Other (Signed)
Physical Therapy Daily Treatment Note  Date:  12/19/2013    Patient Name:  Parker Turner    DOB:  08/11/1950  MRN: [id number]  Restrictions/Precautions:    Pertinent Medical History:RA, OA, PAD, cervical radiculopathy   Medical/Treatment Diagnosis Information:Diagnosis:   Right shoulder pain  Treatment Diagnosis: weakness and tight pecs and biceps tendon impingement        Insurance/Certification information:   [employer]  Physician Information:   Dr. Jacqulyn BathLong  Plan of care signed (Y/N):      Visit# / total visits :2  / 8    Pain level: 3-4/10 At present , can increase to 7-8/10    G-Code (if applicable):      Date / Visit # G-Code Applied:  /       Progress Note: []   Yes  [x]   No  Next due by: Visit #10      History of Injury:    Subjective:   Tape increases postural awareness at shoulder.                             Back - pt notes back, arms, legs continue to be an issue due to RA and OA.     Objective:   8/4 - not specifically TTP , limited extension, IR , flexion, ER bilaterally.      Exercises:  Exercise/Equipment Resistance/Repetitions Other comments        Door pec stretch 20 sec x 3          Cane exercises     extension 10    IR 10    Wall washing Flexion bilaterally 5               McConnell tape R shld  1 & 2  applied              US 50 % R biceps tendon 1.5 w/cm 2 x 8 min    Review of        Other Therapeutic Activities:      Home Exercise Program:    Pt demonstrates understanding of written HEP.      Manual Treatments:      Modalities:      Timed Code Treatment Minutes:  35    Total Treatment Minutes:  35    Treatment/Activity Tolerance:  [x]  Patient tolerated treatment well []  Patient limited by fatigue  []  Patient limited by pain  []  Patient limited by other medical complications  []  Other:     Prognosis: [x]  Good []  Fair  []  Poor    Patient Requires Follow-up: [x]  Yes  []  No    Plan:   [x]  Continue per plan of care []  Alter current plan (see comments)  []  Plan of care initiated []  Hold pending MD  visit []  Discharge    Plan for Next Session:   Cont US /STM, and McConnell taping.  Await Rx for iontophoresis from MD.     Electronically signed by:  Noah CharonHeidi Hermine Feria, PT [phone]

## 2013-12-19 NOTE — Other (Signed)
Physical Therapy Aquatic Flow Sheet   Date:  12/19/2013    Patient Name:  Parker Turner    DOB:  12/10/1950    Pertinent Medical History: RA, OA, PAD, cervical radiculopathy    Diagnosis:  LBP  Treatment Diagnosis:  LBP and L LE weakness    Insurance/Certification information: [employer]  Referring Physician:  Dr. Jacqulyn BathLong     Visit# / total visits:  12/16   Pain level: 3/10       Progress Note: []   Yes  [x]   No  Next due by: Visit #10:      Subjective:   LB is not too bad today.     Objective:  Observation: See eval  Test measurements:      Key  B= Belt DB= Dumbells T= Theratube   G=Gloves N= Noodles W= Weights   P= Paddles WW=Water Walker K= Kickboard        Transfers:  steps       % Immersion: 75%            Ambulation:   Exercises UE:      Forwards 6 Shoulder Shrugs 10x    Lateral 2 Shoulder circles 10x    Marching 1 Scapular Retraction 10x    Retro   Rolling 10x     Cariocas  Push Downs 10x     IR/ER 10x     Punching 10x   Stretching:  Rowing 10x   Gastroc/Soleus 3x20 sec  Elbow Flex/Ext 10x   Hamstring 3x20 sec  Shldr Flex/Ext 10x    Knee flex stretch   Shldr Abd/Add 10x   Piriformis   Horiz Abd/Add 10x    Hip flexor    Arm Circles 10x    SKTC   PNF Diagonals 10x   DKTC 1 min UE oscillations f/b, s/s    ITB   Wall Push-ups    Quad  Figure 8's    Mid back   Buoy pull/push downs    UT  Tband rows    Rhom  Tband lats    Post Shoulder  Lumbar Rot w/ paddles L1 10x L/R   Pec 2x20 secs with noodle  Small ball pull downs 10x            Cervical:       AROM Flex       AROM Extension     LEs exercises:   AROM Side Bending    Marching 12x  AROM Rotation    Heel Raises 12x  Chin tucks    Toe Raises 12x  Chin nods     Squats 12x       Hamstring Curls 12x       Hip Flexion 12x  Balance:     Hip Abduction 12x SLS x60 sec L/R   Hip Circles 12x Tandem stance    TA set 12x NBOS eyes open    Glut Set 12x NBOS eyes closed    Hip Extension  Hand to Opposite Knee 10x   Hip Adduction    Box Step     Hip IR   Noodle Stance     Hip  ER  Stop/Go Gait    Fig 8's  Switch Gait                Seated:  Functional:    Ankle Pumps 30x Step up forward 10x L/R   Ankle circles 10x Step up lateral 10x L/R   Knee flex & ext 30x  Step down 10x L/R   Hip Abd & Adduction 30x Shallow Water squats 10x   Bicycle  30x Crate Lifts    Add Set with ball 10x Lunges forward    LX stab with med ball throws  Lunges lateral    Ankle INV  Lunges retro    Ankle EV  Lower ab curl with noodle      Upper ab curl with ball      Med ball straight lifts      Med ball diagonal lifts      Hydrorider          Noodle:      Leg Press Small 10x L/R Deep Water:    Noodle hang at wall  Jog    Noodle hang deep water x5 mins Jumping Jacks    Noodle Bicycle  Heel to toe      Hand to opposite knee      Cross country skier      Rocking Horse        Timed Code Treatment Minutes:  60     Total Treatment Minutes: 60    Treatment/Activity Tolerance:  [x]  Patient tolerated treatment well []  Patient limited by fatique  []  Patient limited by pain  []  Patient limited by other medical complications  []  Other:    Prognosis: [x]  Good []  Fair  []  Poor    Patient Requires Follow-up: []  Yes  [x]  No    Plan:   []  Continue per plan of care []  Alter current plan (see comments)  []  Plan of care initiated []  Hold pending MD visit [x]  Discharge    Plan for Next Session:  D/C aquatics for LBP.  Issue aquatic HEP.  Continue with land PT for shoulders.     Electronically signed by: Molli Knock, PT  [phone]

## 2013-12-21 NOTE — Other (Signed)
Physical Therapy Daily Treatment Note  Date:  12/21/2013    Patient Name:  Parker Turner    DOB:  12/18/1950  MRN: [id number]  Restrictions/Precautions:    Pertinent Medical History:RA, OA, PAD, cervical radiculopathy   Medical/Treatment Diagnosis Information:Diagnosis:   Right shoulder pain  Treatment Diagnosis: weakness and tight pecs and biceps tendon impingement        Insurance/Certification information:   [employer]  Physician Information:   Dr. Jacqulyn BathLong  Plan of care signed (Y/N):      Visit# / total visits :3  / 8    Pain level: 4/10 At present , can increase to 5-6/10    G-Code (if applicable):      Date / Visit # G-Code Applied:  /       Progress Note: []   Yes  [x]   No  Next due by: Visit #10      History of Injury:    Subjective:   Tape increases postural awareness at shoulder.   R shoulder about the same thus far.                               Objective:   8/4 - not specifically TTP , limited extension, IR , flexion, ER bilaterally.      Exercises:  Exercise/Equipment Resistance/Repetitions Other comments        Door pec stretch 20 sec x 3          Cane exercises     extension 10    IR 10    Wall washing Flexion bilaterally 5               McConnell tape R shld  1 & 2  applied              US 50 % R biceps tendon 1.5 w/cm 2 x 8 min    Review of        Other Therapeutic Activities:      Home Exercise Program:    Pt demonstrates understanding of written HEP.      Manual Treatments:      Modalities:      Timed Code Treatment Minutes:  35    Total Treatment Minutes:  35    Treatment/Activity Tolerance:  [x]  Patient tolerated treatment well []  Patient limited by fatigue  []  Patient limited by pain  []  Patient limited by other medical complications  []  Other:     Prognosis: [x]  Good []  Fair  []  Poor    Patient Requires Follow-up: [x]  Yes  []  No    Plan:   [x]  Continue per plan of care []  Alter current plan (see comments)  []  Plan of care initiated []  Hold pending MD visit []  Discharge    Plan for Next Session:    Cont US /STM, and McConnell taping.  Await Rx for iontophoresis from MD.     Electronically signed by:  Telford NabKimberly J Shawniece Oyola, PTA [phone]

## 2014-01-02 NOTE — Other (Signed)
Physical Therapy Daily Treatment Note  Date:  01/02/2014    Patient Name:  Parker Turner    DOB:  24-Oct-1950  MRN: [id number]  Restrictions/Precautions:    Pertinent Medical History:RA, OA, PAD, cervical radiculopathy   Medical/Treatment Diagnosis Information:Diagnosis:   Right shoulder pain  Treatment Diagnosis: weakness and tight pecs and biceps tendon impingement   Insurance/Certification information:   [employer]  Physician Information:   Dr. Jacqulyn Bath  Plan of care signed (Y/N):      Visit# / total visits :4  / 8    Pain level: 0/10 At present , can increase to 5-6/10    G-Code (if applicable):      Date / Visit # G-Code Applied:  /       Progress Note: []   Yes  [x]   No  Next due by: Visit #10      History of Injury:had epidurals in neck 8 weeks prior to starting therapy    Subjective:   Tape increases postural awareness at shoulder.   R shoulder about the same thus far.   Wakes up with shoulder pain and intermittently during the day                              Objective:   8/4 - not specifically TTP , limited extension, IR , flexion, ER bilaterally.      Exercises:  Exercise/Equipment Resistance/Repetitions Other comments        Door pec stretch 20 sec x 3          Cane exercises  hep   extension 10 hep   IR 10 hep   Wall washing Flexion bilaterally 5 hep             applied Appears to work only for a day.        Saunders cervical traction 15# x 10 min    iONTOPHORESIS hyberesis 80 Ma.min x 2 hours           Other Therapeutic Activities:      Home Exercise Program:    Pt demonstrates understanding of written HEP.    Timed Code Treatment Minutes:  35    Total Treatment Minutes:  35    Treatment/Activity Tolerance:  [x]  Patient tolerated treatment well []  Patient limited by fatigue  []  Patient limited by pain  []  Patient limited by other medical complications  []  Other:     Prognosis: [x]  Good []  Fair  []  Poor    Patient Requires Follow-up: [x]  Yes  []  No    Plan:   [x]  Continue per plan of care []  Alter current  plan (see comments)  []  Plan of care initiated []  Hold pending MD visit []  Discharge    Plan for Next Session:   Assess traction and ionto     Electronically signed by:  Christena Flake  [phone]

## 2014-01-04 NOTE — Other (Addendum)
Physical Therapy Daily Treatment Note  Date:  01/04/2014    Patient Name:  Parker Turner    DOB:  09/24/1950  MRN: [id number]  Restrictions/Precautions:    Pertinent Medical History:RA, OA, PAD, cervical radiculopathy   Medical/Treatment Diagnosis Information:Diagnosis:   Right shoulder pain  Treatment Diagnosis: weakness and tight pecs and biceps tendon impingement   Insurance/Certification information:   [employer]  Physician Information:   Dr. Jacqulyn Bath  Plan of care signed (Y/N):      Visit# / total visits :  5 / 8    Pain level: 0/10 At present , can increase to 5-6/10    G-Code (if applicable):      Date / Visit # G-Code Applied:  /       Progress Note: []   Yes  [x]   No  Next due by: Visit #10      History of Injury:had epidurals in neck 8 weeks prior to starting therapy    Subjective:   Better after last treatment  Objective:   8/4 - not specifically TTP , limited extension, IR , flexion, ER bilaterally.      Exercises:  Exercise/Equipment Resistance/Repetitions Other comments        hep      hep   hep   hep   hep        STM biceps tendon and teres minor/infraspinatus X 20 min    Ice massage to each 5 min x2         Check out for home over the weekend   iONTOPHORESIS hyberesis 80 Ma.min x 2 hours both areas #2          Other Therapeutic Activities:      Home Exercise Program:    Pt demonstrates understanding of written HEP.    Timed Code Treatment Minutes:  35    Total Treatment Minutes:  35    Treatment/Activity Tolerance:  [x]  Patient tolerated treatment well []  Patient limited by fatigue  []  Patient limited by pain  []  Patient limited by other medical complications  []  Other:     Prognosis: [x]  Good []  Fair  []  Poor    Patient Requires Follow-up: [x]  Yes  []  No    Plan:   [x]  Continue per plan of care []  Alter current plan (see comments)  []  Plan of care initiated []  Hold pending MD visit []  Discharge    Plan for Next Session: Add theraband RTC strengthening    Electronically signed by:  Christena Flake   [phone]

## 2014-01-09 NOTE — Other (Signed)
Physical Therapy Daily Treatment Note  Date:  01/09/2014    Patient Name:  Parker Turner    DOB:  1950-08-12  MRN: [id number]  Restrictions/Precautions:    Pertinent Medical History:RA, OA, PAD, cervical radiculopathy   Medical/Treatment Diagnosis Information:Diagnosis:   Right shoulder pain  Treatment Diagnosis: weakness and tight pecs and biceps tendon impingement   Insurance/Certification information:   [employer]  Physician Information:   Dr. Jacqulyn Bath  Plan of care signed (Y/N):      Visit# / total visits :  6 / 8    Pain level: 0/10 At present , can increase to 5-6/10    G-Code (if applicable):      Date / Visit # G-Code Applied:  /       Progress Note:   Yes    No  Next due by: Visit #10      History of Injury:had epidurals in neck 8 weeks prior to starting therapy    Subjective:   Better after last treatment Woke him up this morning when he was laying on it  Objective:   8/4 - not specifically TTP , limited extension, IR , flexion, ER bilaterally.      Exercises:  Exercise/Equipment Resistance/Repetitions Other comments   UBE 3 min f/b    Door pec stretch 20 sec x 3  hep   Red theraband Rows 2 x 10  Lower trap 2 x 10    hep   hep   hep   hep   Theraband RTC Flexion  Ext  IR  ER  Abd Add all   STM biceps tendon and teres minor/infraspinatus X 20 min    Ice massage to each 5 min x2         Check out for home over the weekend   iONTOPHORESIS hyberesis 80 Ma.min x 2 hours both areas #3          Other Therapeutic Activities:      Home Exercise Program:    Pt demonstrates understanding of written HEP.    Timed Code Treatment Minutes:  35    Total Treatment Minutes:  35    Treatment/Activity Tolerance:   Patient tolerated treatment well  Patient limited by fatigue   Patient limited by pain   Patient limited by other medical complications   Other:     Prognosis:  Good  Fair   Poor    Patient Requires Follow-up:  Yes   No    Plan:    Continue per plan of care  Alter current plan (see  comments)   Plan of care initiated  Hold pending MD visit  Discharge    Plan for Next Session: Add theraband RTC strengthening    Electronically signed by:  Christena Flake  [phone]

## 2014-01-12 NOTE — Other (Signed)
Physical Therapy Daily Treatment Note  Date:  01/12/2014    Patient Name:  Parker Turner    DOB:  10-12-1950  MRN: [id number]    Pertinent Medical History:RA, OA, PAD, cervical radiculopathy     Medical/Treatment Diagnosis Information:  Diagnosis: Right shoulder pain  Treatment Diagnosis: weakness and tight pecs and biceps tendon impingement     Insurance/Certification information:   [employer]  Physician Information:   Dr. Jacqulyn Bath  Plan of care signed (Y/N):      Visit# / total visits :  7/8  Pain level: 4-5/10 At present     Progress Note:   Yes    No  Next due by: Visit #10      History of Injury:had epidurals in neck 8 weeks prior to starting therapy    Subjective:   Increase pain started yesterday after trying to pull start the weed eater multiple times.     Objective:   8/4 - not specifically TTP , limited extension, IR , flexion, ER bilaterally.      Exercises:  Exercise/Equipment Resistance/Repetitions Other comments   UBE 3 min f/b    Door pec stretch 20 sec x 3  hep   Red theraband Rows 2 x 10  Lower trap 2 x 10    hep   hep   hep   hep   Theraband RTC Flexion   Ext - red 10x  IR -  Red 10x  ER - red 10x  Abd Add        add   STM biceps tendon and teres minor/infraspinatus x20 mins    Ice massage to each 5 min          Check out for home over the weekend   IONTOPHORESIS hyberesis 80 Ma.min x 2 hours both areas #4          Other Therapeutic Activities:      Home Exercise Program:    Pt demonstrates understanding of written HEP.    Timed Code Treatment Minutes:  35    Total Treatment Minutes:  35    Treatment/Activity Tolerance:   Patient tolerated treatment well  Patient limited by fatigue   Patient limited by pain   Patient limited by other medical complications   Other:     Prognosis:  Good  Fair   Poor    Patient Requires Follow-up:  Yes   No    Plan:    Continue per plan of care  Alter current plan (see comments)   Plan of care initiated  Hold pending MD visit   Discharge    Plan for Next Session: add flexion and abd tband strengthening.     Electronically signed by:  Molli Knock, PT  [phone]

## 2014-01-16 NOTE — Other (Signed)
Physical Therapy Daily Treatment Note  Date:  01/16/2014    Patient Name:  Parker Turner    DOB:  01/23/1951  MRN: [id number]    Pertinent Medical History:RA, OA, PAD, cervical radiculopathy     Medical/Treatment Diagnosis Information:  Diagnosis: Right shoulder pain  Treatment Diagnosis: weakness and tight pecs and biceps tendon impingement     Insurance/Certification information:   [employer]  Physician Information:   Dr. Jacqulyn Bath   Plan of care signed (Y/N):      Visit# / total visits :  8/8  Pain level: 3-4/10 At present     Progress Note:   Yes    No  Next due by: Visit #10      History of Injury:had epidurals in neck 8 weeks prior to starting therapy    Subjective:   Pain in shld woke him up this am. Overall shld improved 25% .      Objective:   8/4 - not specifically TTP , limited extension, IR , flexion, ER bilaterally.      Exercises:  Exercise/Equipment Resistance/Repetitions Other comments   UBE 3 min f/b    Door pec stretch 20 sec x 3  hep   Red theraband Rows 2 x 10  Lower trap 2 x 10    hep   hep   hep   hep   Theraband RTC Flexion  Red x 10   Ext - red  2x10  IR -  Red 10x  ER - red 10x  Abd  Red x 10    add red x 10    Issued written HEP         STM biceps tendon and teres minor/infraspinatus x20 mins    Ice massage to each 5 min          Check out for home over the weekend   IONTOPHORESIS #4  Unavailable today          Other Therapeutic Activities:      Home Exercise Program:    Pt demonstrates understanding of written HEP.    Timed Code Treatment Minutes:  35    Total Treatment Minutes:  35    Treatment/Activity Tolerance:   Patient tolerated treatment well  Patient limited by fatigue   Patient limited by pain   Patient limited by other medical complications   Other:     Prognosis:  Good  Fair   Poor    Patient Requires Follow-up:  Yes   No    Plan:    Continue per plan of care  Alter current plan (see comments)   Plan of care initiated  Hold pending MD visit   Discharge    Plan for Next Session: add flexion and abd tband strengthening.  PT recommended F/U with MD.    Electronically signed by:  Telford Nab, PTA [phone]

## 2014-01-16 NOTE — Progress Notes (Signed)
Outpatient Physical Therapy   [facility]   Phone: [phone]   Fax: [phone]    [facility]  Phone: [phone]   Fax: [phone]   [facility]              Phone: [phone]   Fax: [phone]     Physical Therapy Progress/Discharge Note  Date: 01/16/2014        Patient Name:  Parker Turner    DOB:  06/24/1950  MRN: [id number]    Pertinent Medical History:RA, OA, PAD, cervical radiculopathy     Medical/Treatment Diagnosis Information:  Diagnosis: Right shoulder pain  Treatment Diagnosis: weakness and tight pecs and biceps tendon impingement    Insurance/Certification information: [employer]  Physician Information: Dr. Jacqulyn Bath   Plan of care signed (Y/N):     Visit# / total visits : 8/8  Pain level: 3-4/10 At present          Plan of Care/Treatment to date:   Therapeutic Exercise     Modalities:   Therapeutic Activity      Ultrasound   Electrical Stimulation   Gait Training       Cervical Traction     Lumbar Traction   Neuromuscular Re-education   Cold/hotpack  Iontophoresis   Instruction in HEP      Other:   Manual Therapy            Aquatic Therapy                           ?      Significant Findings At Last Visit/Comments:    Subjective:  Pain in shld woke him up this am. Overall shld improved 25% .        Objective:  Observation: forward head and shoulders with tight pecs  Test measurements:  RTC strength 4+/5  , TTP biceps tendon, tight pecs and head of humerus anterior in socket           Flex 150 pain at end range, abd 142 with pain, IR/ER 70 deg pain in chest area       Assessment:  Summary:  Patient has had minimal progress with decreasing shoulder pain.  Recommend patient see orthopedic specialist for further diagnosis      Current Frequency/Duration:  # Days per week:  1 day # Weeks:  1 week  4 weeks       2 days?    2 weeks  5 weeks       3 days    3 weeks  6 weeks     Rehab Potential:  Excellent  Good  Fair    Poor     Plan:    D/C at this time for further MD evaluation  Electronically signed by:  Christena Flake  [phone]    If you have any questions or concerns, please don't hesitate to call.  Thank you for your referral.    Physician Signature:________________________________Date:__________________  By signing above, therapist???s plan is approved by physician

## 2016-11-03 ENCOUNTER — Encounter: Admit: 2016-11-03 | Discharge: 2016-11-03 | Payer: MEDICARE

## 2016-11-04 ENCOUNTER — Encounter: Admit: 2016-11-04 | Discharge: 2016-11-04 | Payer: MEDICARE

## 2016-11-04 DIAGNOSIS — I2510 Atherosclerotic heart disease of native coronary artery without angina pectoris: Principal | ICD-10-CM

## 2016-11-04 DIAGNOSIS — Z0189 Encounter for other specified special examinations: ICD-10-CM

## 2016-11-04 DIAGNOSIS — E785 Hyperlipidemia, unspecified: ICD-10-CM

## 2016-11-17 ENCOUNTER — Encounter: Admit: 2016-11-17 | Discharge: 2016-11-17 | Payer: MEDICARE

## 2016-11-26 ENCOUNTER — Ambulatory Visit: Admit: 2016-11-26 | Discharge: 2016-11-27 | Payer: MEDICARE

## 2016-11-26 ENCOUNTER — Encounter: Admit: 2016-11-26 | Discharge: 2016-11-26 | Payer: MEDICARE

## 2016-11-26 DIAGNOSIS — I2510 Atherosclerotic heart disease of native coronary artery without angina pectoris: Principal | ICD-10-CM

## 2016-11-26 DIAGNOSIS — Z0189 Encounter for other specified special examinations: ICD-10-CM

## 2016-11-26 DIAGNOSIS — E785 Hyperlipidemia, unspecified: Secondary | ICD-10-CM

## 2016-11-27 ENCOUNTER — Encounter: Admit: 2016-11-27 | Discharge: 2016-11-27 | Payer: MEDICARE

## 2016-12-16 ENCOUNTER — Encounter: Admit: 2016-12-16 | Discharge: 2016-12-16 | Payer: MEDICARE

## 2016-12-16 NOTE — Telephone Encounter
Received fax from Dr. Christell ConstantMoore with [facility] stating that patient is being evaluated for a urolift procedure.  They are requesting cardiac clearance and recommendations for holding aspirin.  The procedure has not yet been scheduled.    Pt last saw Dr. Geronimo Bootosamond on 11/26/16 and had an exercise echo on 04/05/2015.  He is not having any cardiac symptoms.      Will route to Dr. Geronimo Bootosamond for recommendations.

## 2016-12-18 NOTE — Telephone Encounter
Left message for [facility] that Dr.Rosamond will be on vacation until 12/29/16. Dr. Geronimo Bootosamond will be at [facility]. Placed form in folder for Dr.Rosamond's signature.  Sent IB message to [facility].

## 2017-02-25 LAB — THYROID STIMULATING HORMONE-TSH: Lab: 0.7 — ABNORMAL LOW (ref 23–31)

## 2017-02-25 LAB — COMPREHENSIVE METABOLIC PANEL
Lab: 1.3 — ABNORMAL HIGH (ref 0.0–1.0)
Lab: 138
Lab: 14
Lab: 25
Lab: 3.7
Lab: 33
Lab: 6.7 — ABNORMAL HIGH (ref 0.0–1.0)
Lab: 77

## 2017-02-25 LAB — CBC
Lab: 4.7
Lab: 6.3

## 2017-02-25 LAB — PHOSPHORUS: Lab: 2.1 — ABNORMAL LOW (ref 2.3–4.7)

## 2017-02-25 LAB — LIPID PROFILE
Lab: 192
Lab: 5 — ABNORMAL HIGH (ref 8.8–10.0)

## 2017-02-25 LAB — PROSTATIC SPECIFIC ANTIGEN-PSA: Lab: 1.5 — ABNORMAL HIGH (ref <100)

## 2017-02-25 LAB — HEMOGLOBIN A1C: Lab: 6.3 — ABNORMAL HIGH (ref 5–40)

## 2017-07-02 LAB — COMPREHENSIVE METABOLIC PANEL: Lab: 139

## 2017-07-20 ENCOUNTER — Encounter: Admit: 2017-07-20 | Discharge: 2017-07-20 | Payer: MEDICARE

## 2017-07-20 ENCOUNTER — Ambulatory Visit: Admit: 2017-07-20 | Discharge: 2017-07-21 | Payer: MEDICARE

## 2017-07-20 DIAGNOSIS — I2510 Atherosclerotic heart disease of native coronary artery without angina pectoris: Principal | ICD-10-CM

## 2017-07-20 DIAGNOSIS — Z0189 Encounter for other specified special examinations: ICD-10-CM

## 2017-07-20 DIAGNOSIS — E785 Hyperlipidemia, unspecified: Secondary | ICD-10-CM

## 2017-07-22 ENCOUNTER — Encounter: Admit: 2017-07-22 | Discharge: 2017-07-22 | Payer: MEDICARE

## 2017-07-28 ENCOUNTER — Ambulatory Visit: Admit: 2017-07-28 | Discharge: 2017-07-29 | Payer: MEDICARE

## 2017-07-28 DIAGNOSIS — I2510 Atherosclerotic heart disease of native coronary artery without angina pectoris: Principal | ICD-10-CM

## 2017-07-28 MED ORDER — AMINOPHYLLINE 500 MG/20 ML IV SOLN
50 mg | INTRAVENOUS | 0 refills | Status: AC | PRN
Start: 2017-07-28 — End: ?

## 2017-07-28 MED ORDER — REGADENOSON 0.4 MG/5 ML IV SYRG
.4 mg | Freq: Once | INTRAVENOUS | 0 refills | Status: CP
Start: 2017-07-28 — End: ?

## 2017-08-24 ENCOUNTER — Encounter: Admit: 2017-08-24 | Discharge: 2017-08-24 | Payer: MEDICARE

## 2017-08-25 ENCOUNTER — Inpatient Hospital Stay
Admit: 2017-08-25 | Discharge: 2017-08-25 | Disposition: A | Discharge status: Home / Self Care | Payer: Auto Insurance (includes no fault) | Attending: Emergency Medicine

## 2017-08-25 ENCOUNTER — Emergency Department: Admit: 2017-08-25 | Payer: Auto Insurance (includes no fault) | Primary: Internal Medicine

## 2017-08-25 DIAGNOSIS — S20219A Contusion of unspecified front wall of thorax, initial encounter: Secondary | ICD-10-CM

## 2017-08-25 LAB — EKG 12-LEAD
Atrial Rate: 61 {beats}/min
P Axis: -4 degrees
P-R Interval: 188 ms
Q-T Interval: 412 ms
QRS Duration: 78 ms
QTc Calculation (Bazett): 414 ms
R Axis: 18 degrees
T Axis: 43 degrees
Ventricular Rate: 61 {beats}/min

## 2017-08-25 MED ORDER — HYDROCODONE-ACETAMINOPHEN 5-325 MG PO TABS
5-325 MG | Freq: Once | ORAL | Status: AC
Start: 2017-08-25 — End: 2017-08-25
  Administered 2017-08-25: 16:00:00 1 via ORAL

## 2017-08-25 MED FILL — HYDROCODONE-ACETAMINOPHEN 5-325 MG PO TABS: 5-325 mg | ORAL | Qty: 1
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
## 2018-02-14 LAB — BASIC METABOLIC PANEL
Lab: 174 — ABNORMAL HIGH (ref 80–115)
Lab: 9.4

## 2018-04-07 LAB — CBC
Lab: 14
Lab: 4.7
Lab: 45
Lab: 8.6

## 2018-04-07 LAB — COMPREHENSIVE METABOLIC PANEL
Lab: 138
Lab: 19
Lab: 25
Lab: 86

## 2018-04-29 ENCOUNTER — Encounter: Admit: 2018-04-29 | Discharge: 2018-04-29 | Payer: MEDICARE

## 2018-05-12 ENCOUNTER — Ambulatory Visit: Admit: 2018-05-12 | Discharge: 2018-05-12 | Payer: MEDICARE

## 2018-05-12 ENCOUNTER — Encounter: Admit: 2018-05-12 | Discharge: 2018-05-12 | Payer: MEDICARE

## 2018-05-12 DIAGNOSIS — Z0189 Encounter for other specified special examinations: ICD-10-CM

## 2018-05-12 DIAGNOSIS — I2510 Atherosclerotic heart disease of native coronary artery without angina pectoris: Principal | ICD-10-CM

## 2018-05-12 DIAGNOSIS — E785 Hyperlipidemia, unspecified: ICD-10-CM
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
